# Patient Record
Sex: Male | Born: 1935
Health system: Southern US, Community
[De-identification: ages and names within clinical notes are randomized; demographics above are authoritative.]

## PROBLEM LIST (undated history)

## (undated) DIAGNOSIS — E119 Type 2 diabetes mellitus without complications: Secondary | ICD-10-CM

## (undated) DIAGNOSIS — J449 Chronic obstructive pulmonary disease, unspecified: Secondary | ICD-10-CM

## (undated) DIAGNOSIS — Z720 Tobacco use: Secondary | ICD-10-CM

## (undated) DIAGNOSIS — I10 Essential (primary) hypertension: Secondary | ICD-10-CM

---

## 2009-06-14 ENCOUNTER — Emergency Department: Payer: Self-pay | Admitting: Internal Medicine

## 2015-01-08 DIAGNOSIS — E119 Type 2 diabetes mellitus without complications: Secondary | ICD-10-CM | POA: Diagnosis not present

## 2015-03-06 DIAGNOSIS — I10 Essential (primary) hypertension: Secondary | ICD-10-CM | POA: Diagnosis not present

## 2015-03-06 DIAGNOSIS — E119 Type 2 diabetes mellitus without complications: Secondary | ICD-10-CM | POA: Diagnosis not present

## 2015-03-06 DIAGNOSIS — J449 Chronic obstructive pulmonary disease, unspecified: Secondary | ICD-10-CM | POA: Diagnosis not present

## 2015-03-06 DIAGNOSIS — Z125 Encounter for screening for malignant neoplasm of prostate: Secondary | ICD-10-CM | POA: Diagnosis not present

## 2015-03-13 DIAGNOSIS — I10 Essential (primary) hypertension: Secondary | ICD-10-CM | POA: Diagnosis not present

## 2015-03-13 DIAGNOSIS — H04123 Dry eye syndrome of bilateral lacrimal glands: Secondary | ICD-10-CM | POA: Diagnosis not present

## 2015-03-13 DIAGNOSIS — E119 Type 2 diabetes mellitus without complications: Secondary | ICD-10-CM | POA: Diagnosis not present

## 2015-03-13 DIAGNOSIS — J42 Unspecified chronic bronchitis: Secondary | ICD-10-CM | POA: Diagnosis not present

## 2015-04-12 DIAGNOSIS — E119 Type 2 diabetes mellitus without complications: Secondary | ICD-10-CM | POA: Diagnosis not present

## 2015-07-06 DIAGNOSIS — E119 Type 2 diabetes mellitus without complications: Secondary | ICD-10-CM | POA: Diagnosis not present

## 2015-09-11 DIAGNOSIS — I10 Essential (primary) hypertension: Secondary | ICD-10-CM | POA: Diagnosis not present

## 2015-09-11 DIAGNOSIS — E119 Type 2 diabetes mellitus without complications: Secondary | ICD-10-CM | POA: Diagnosis not present

## 2015-09-11 DIAGNOSIS — J42 Unspecified chronic bronchitis: Secondary | ICD-10-CM | POA: Diagnosis not present

## 2015-09-11 DIAGNOSIS — H04123 Dry eye syndrome of bilateral lacrimal glands: Secondary | ICD-10-CM | POA: Diagnosis not present

## 2015-10-09 DIAGNOSIS — E119 Type 2 diabetes mellitus without complications: Secondary | ICD-10-CM | POA: Diagnosis not present

## 2015-12-20 DIAGNOSIS — E119 Type 2 diabetes mellitus without complications: Secondary | ICD-10-CM | POA: Diagnosis not present

## 2015-12-20 DIAGNOSIS — J449 Chronic obstructive pulmonary disease, unspecified: Secondary | ICD-10-CM | POA: Diagnosis not present

## 2015-12-20 DIAGNOSIS — I10 Essential (primary) hypertension: Secondary | ICD-10-CM | POA: Diagnosis not present

## 2015-12-20 DIAGNOSIS — F17200 Nicotine dependence, unspecified, uncomplicated: Secondary | ICD-10-CM | POA: Diagnosis not present

## 2016-03-12 DIAGNOSIS — I10 Essential (primary) hypertension: Secondary | ICD-10-CM | POA: Diagnosis not present

## 2016-03-12 DIAGNOSIS — E119 Type 2 diabetes mellitus without complications: Secondary | ICD-10-CM | POA: Diagnosis not present

## 2016-03-19 DIAGNOSIS — I10 Essential (primary) hypertension: Secondary | ICD-10-CM | POA: Diagnosis not present

## 2016-03-19 DIAGNOSIS — E1122 Type 2 diabetes mellitus with diabetic chronic kidney disease: Secondary | ICD-10-CM | POA: Diagnosis not present

## 2016-03-19 DIAGNOSIS — N181 Chronic kidney disease, stage 1: Secondary | ICD-10-CM | POA: Diagnosis not present

## 2016-03-19 DIAGNOSIS — J439 Emphysema, unspecified: Secondary | ICD-10-CM | POA: Diagnosis not present

## 2016-03-19 DIAGNOSIS — R490 Dysphonia: Secondary | ICD-10-CM | POA: Diagnosis not present

## 2016-03-19 DIAGNOSIS — R634 Abnormal weight loss: Secondary | ICD-10-CM | POA: Diagnosis not present

## 2016-06-19 DIAGNOSIS — E1122 Type 2 diabetes mellitus with diabetic chronic kidney disease: Secondary | ICD-10-CM | POA: Diagnosis not present

## 2016-06-19 DIAGNOSIS — R5381 Other malaise: Secondary | ICD-10-CM | POA: Diagnosis not present

## 2016-06-19 DIAGNOSIS — R5382 Chronic fatigue, unspecified: Secondary | ICD-10-CM | POA: Diagnosis not present

## 2016-06-19 DIAGNOSIS — N181 Chronic kidney disease, stage 1: Secondary | ICD-10-CM | POA: Diagnosis not present

## 2016-06-19 DIAGNOSIS — I10 Essential (primary) hypertension: Secondary | ICD-10-CM | POA: Diagnosis not present

## 2016-06-19 DIAGNOSIS — J439 Emphysema, unspecified: Secondary | ICD-10-CM | POA: Diagnosis not present

## 2016-06-19 DIAGNOSIS — E782 Mixed hyperlipidemia: Secondary | ICD-10-CM | POA: Diagnosis not present

## 2016-07-03 DIAGNOSIS — H524 Presbyopia: Secondary | ICD-10-CM | POA: Diagnosis not present

## 2016-07-03 DIAGNOSIS — H5210 Myopia, unspecified eye: Secondary | ICD-10-CM | POA: Diagnosis not present

## 2016-07-03 DIAGNOSIS — H353131 Nonexudative age-related macular degeneration, bilateral, early dry stage: Secondary | ICD-10-CM | POA: Diagnosis not present

## 2016-07-04 DIAGNOSIS — Z0100 Encounter for examination of eyes and vision without abnormal findings: Secondary | ICD-10-CM | POA: Diagnosis not present

## 2016-12-17 DIAGNOSIS — I10 Essential (primary) hypertension: Secondary | ICD-10-CM | POA: Diagnosis not present

## 2016-12-17 DIAGNOSIS — E782 Mixed hyperlipidemia: Secondary | ICD-10-CM | POA: Diagnosis not present

## 2016-12-17 DIAGNOSIS — E1122 Type 2 diabetes mellitus with diabetic chronic kidney disease: Secondary | ICD-10-CM | POA: Diagnosis not present

## 2016-12-17 DIAGNOSIS — N181 Chronic kidney disease, stage 1: Secondary | ICD-10-CM | POA: Diagnosis not present

## 2016-12-24 DIAGNOSIS — Z0000 Encounter for general adult medical examination without abnormal findings: Secondary | ICD-10-CM | POA: Diagnosis not present

## 2016-12-24 DIAGNOSIS — I10 Essential (primary) hypertension: Secondary | ICD-10-CM | POA: Diagnosis not present

## 2016-12-24 DIAGNOSIS — R5382 Chronic fatigue, unspecified: Secondary | ICD-10-CM | POA: Diagnosis not present

## 2016-12-24 DIAGNOSIS — N181 Chronic kidney disease, stage 1: Secondary | ICD-10-CM | POA: Diagnosis not present

## 2016-12-24 DIAGNOSIS — E1122 Type 2 diabetes mellitus with diabetic chronic kidney disease: Secondary | ICD-10-CM | POA: Diagnosis not present

## 2016-12-24 DIAGNOSIS — J439 Emphysema, unspecified: Secondary | ICD-10-CM | POA: Diagnosis not present

## 2017-06-18 DIAGNOSIS — E538 Deficiency of other specified B group vitamins: Secondary | ICD-10-CM | POA: Diagnosis not present

## 2017-06-18 DIAGNOSIS — I10 Essential (primary) hypertension: Secondary | ICD-10-CM | POA: Diagnosis not present

## 2017-06-18 DIAGNOSIS — N181 Chronic kidney disease, stage 1: Secondary | ICD-10-CM | POA: Diagnosis not present

## 2017-06-18 DIAGNOSIS — E1122 Type 2 diabetes mellitus with diabetic chronic kidney disease: Secondary | ICD-10-CM | POA: Diagnosis not present

## 2017-06-25 DIAGNOSIS — E1122 Type 2 diabetes mellitus with diabetic chronic kidney disease: Secondary | ICD-10-CM | POA: Diagnosis not present

## 2017-06-25 DIAGNOSIS — I10 Essential (primary) hypertension: Secondary | ICD-10-CM | POA: Diagnosis not present

## 2017-06-25 DIAGNOSIS — J439 Emphysema, unspecified: Secondary | ICD-10-CM | POA: Diagnosis not present

## 2017-06-25 DIAGNOSIS — N181 Chronic kidney disease, stage 1: Secondary | ICD-10-CM | POA: Diagnosis not present

## 2017-06-25 DIAGNOSIS — F17200 Nicotine dependence, unspecified, uncomplicated: Secondary | ICD-10-CM | POA: Diagnosis not present

## 2017-12-24 DIAGNOSIS — N181 Chronic kidney disease, stage 1: Secondary | ICD-10-CM | POA: Diagnosis not present

## 2017-12-24 DIAGNOSIS — I10 Essential (primary) hypertension: Secondary | ICD-10-CM | POA: Diagnosis not present

## 2017-12-24 DIAGNOSIS — E1122 Type 2 diabetes mellitus with diabetic chronic kidney disease: Secondary | ICD-10-CM | POA: Diagnosis not present

## 2017-12-31 DIAGNOSIS — E1122 Type 2 diabetes mellitus with diabetic chronic kidney disease: Secondary | ICD-10-CM | POA: Diagnosis not present

## 2017-12-31 DIAGNOSIS — I10 Essential (primary) hypertension: Secondary | ICD-10-CM | POA: Diagnosis not present

## 2017-12-31 DIAGNOSIS — N181 Chronic kidney disease, stage 1: Secondary | ICD-10-CM | POA: Diagnosis not present

## 2017-12-31 DIAGNOSIS — Z0000 Encounter for general adult medical examination without abnormal findings: Secondary | ICD-10-CM | POA: Diagnosis not present

## 2017-12-31 DIAGNOSIS — J439 Emphysema, unspecified: Secondary | ICD-10-CM | POA: Diagnosis not present

## 2017-12-31 DIAGNOSIS — F17200 Nicotine dependence, unspecified, uncomplicated: Secondary | ICD-10-CM | POA: Diagnosis not present

## 2018-06-23 DIAGNOSIS — I10 Essential (primary) hypertension: Secondary | ICD-10-CM | POA: Diagnosis not present

## 2018-06-23 DIAGNOSIS — N181 Chronic kidney disease, stage 1: Secondary | ICD-10-CM | POA: Diagnosis not present

## 2018-06-23 DIAGNOSIS — E1122 Type 2 diabetes mellitus with diabetic chronic kidney disease: Secondary | ICD-10-CM | POA: Diagnosis not present

## 2018-07-01 DIAGNOSIS — E119 Type 2 diabetes mellitus without complications: Secondary | ICD-10-CM | POA: Diagnosis not present

## 2018-07-01 DIAGNOSIS — I10 Essential (primary) hypertension: Secondary | ICD-10-CM | POA: Diagnosis not present

## 2018-07-01 DIAGNOSIS — E538 Deficiency of other specified B group vitamins: Secondary | ICD-10-CM | POA: Diagnosis not present

## 2018-07-01 DIAGNOSIS — Z0000 Encounter for general adult medical examination without abnormal findings: Secondary | ICD-10-CM | POA: Diagnosis not present

## 2018-07-01 DIAGNOSIS — J439 Emphysema, unspecified: Secondary | ICD-10-CM | POA: Diagnosis not present

## 2018-07-27 DIAGNOSIS — B351 Tinea unguium: Secondary | ICD-10-CM | POA: Diagnosis not present

## 2018-07-27 DIAGNOSIS — E1142 Type 2 diabetes mellitus with diabetic polyneuropathy: Secondary | ICD-10-CM | POA: Diagnosis not present

## 2018-10-27 DIAGNOSIS — B351 Tinea unguium: Secondary | ICD-10-CM | POA: Diagnosis not present

## 2018-10-27 DIAGNOSIS — E1142 Type 2 diabetes mellitus with diabetic polyneuropathy: Secondary | ICD-10-CM | POA: Diagnosis not present

## 2018-10-27 DIAGNOSIS — L851 Acquired keratosis [keratoderma] palmaris et plantaris: Secondary | ICD-10-CM | POA: Diagnosis not present

## 2018-12-29 DIAGNOSIS — E119 Type 2 diabetes mellitus without complications: Secondary | ICD-10-CM | POA: Diagnosis not present

## 2018-12-29 DIAGNOSIS — I10 Essential (primary) hypertension: Secondary | ICD-10-CM | POA: Diagnosis not present

## 2019-01-05 DIAGNOSIS — I10 Essential (primary) hypertension: Secondary | ICD-10-CM | POA: Diagnosis not present

## 2019-01-05 DIAGNOSIS — E119 Type 2 diabetes mellitus without complications: Secondary | ICD-10-CM | POA: Diagnosis not present

## 2019-01-05 DIAGNOSIS — Z0000 Encounter for general adult medical examination without abnormal findings: Secondary | ICD-10-CM | POA: Diagnosis not present

## 2019-01-05 DIAGNOSIS — J439 Emphysema, unspecified: Secondary | ICD-10-CM | POA: Diagnosis not present

## 2019-01-05 DIAGNOSIS — F17200 Nicotine dependence, unspecified, uncomplicated: Secondary | ICD-10-CM | POA: Diagnosis not present

## 2019-01-05 DIAGNOSIS — H5000 Unspecified esotropia: Secondary | ICD-10-CM | POA: Diagnosis not present

## 2019-02-01 DIAGNOSIS — E119 Type 2 diabetes mellitus without complications: Secondary | ICD-10-CM | POA: Diagnosis not present

## 2019-02-01 DIAGNOSIS — H353190 Nonexudative age-related macular degeneration, unspecified eye, stage unspecified: Secondary | ICD-10-CM | POA: Diagnosis not present

## 2019-02-02 DIAGNOSIS — E1142 Type 2 diabetes mellitus with diabetic polyneuropathy: Secondary | ICD-10-CM | POA: Diagnosis not present

## 2019-02-02 DIAGNOSIS — L851 Acquired keratosis [keratoderma] palmaris et plantaris: Secondary | ICD-10-CM | POA: Diagnosis not present

## 2019-02-02 DIAGNOSIS — B351 Tinea unguium: Secondary | ICD-10-CM | POA: Diagnosis not present

## 2019-06-29 DIAGNOSIS — E119 Type 2 diabetes mellitus without complications: Secondary | ICD-10-CM | POA: Diagnosis not present

## 2019-06-29 DIAGNOSIS — I10 Essential (primary) hypertension: Secondary | ICD-10-CM | POA: Diagnosis not present

## 2019-07-06 DIAGNOSIS — I10 Essential (primary) hypertension: Secondary | ICD-10-CM | POA: Diagnosis not present

## 2019-07-06 DIAGNOSIS — F17200 Nicotine dependence, unspecified, uncomplicated: Secondary | ICD-10-CM | POA: Diagnosis not present

## 2019-07-06 DIAGNOSIS — E538 Deficiency of other specified B group vitamins: Secondary | ICD-10-CM | POA: Diagnosis not present

## 2019-07-06 DIAGNOSIS — J439 Emphysema, unspecified: Secondary | ICD-10-CM | POA: Diagnosis not present

## 2019-07-06 DIAGNOSIS — Z0000 Encounter for general adult medical examination without abnormal findings: Secondary | ICD-10-CM | POA: Diagnosis not present

## 2019-07-06 DIAGNOSIS — E119 Type 2 diabetes mellitus without complications: Secondary | ICD-10-CM | POA: Diagnosis not present

## 2019-11-06 ENCOUNTER — Inpatient Hospital Stay
Admission: EM | Admit: 2019-11-06 | Discharge: 2019-11-25 | DRG: 551 | Disposition: A | Discharge status: Hospice | Payer: Medicare HMO | Source: Skilled Nursing Facility | Attending: Internal Medicine | Admitting: Internal Medicine

## 2019-11-06 ENCOUNTER — Observation Stay: Payer: Medicare HMO

## 2019-11-06 ENCOUNTER — Emergency Department: Payer: Medicare HMO

## 2019-11-06 ENCOUNTER — Encounter: Payer: Self-pay | Admitting: Emergency Medicine

## 2019-11-06 ENCOUNTER — Other Ambulatory Visit: Payer: Self-pay

## 2019-11-06 DIAGNOSIS — E119 Type 2 diabetes mellitus without complications: Secondary | ICD-10-CM

## 2019-11-06 DIAGNOSIS — R296 Repeated falls: Secondary | ICD-10-CM | POA: Diagnosis present

## 2019-11-06 DIAGNOSIS — S32010A Wedge compression fracture of first lumbar vertebra, initial encounter for closed fracture: Secondary | ICD-10-CM | POA: Diagnosis not present

## 2019-11-06 DIAGNOSIS — S32009D Unspecified fracture of unspecified lumbar vertebra, subsequent encounter for fracture with routine healing: Secondary | ICD-10-CM | POA: Diagnosis not present

## 2019-11-06 DIAGNOSIS — Z515 Encounter for palliative care: Secondary | ICD-10-CM | POA: Diagnosis not present

## 2019-11-06 DIAGNOSIS — E876 Hypokalemia: Secondary | ICD-10-CM

## 2019-11-06 DIAGNOSIS — Z6822 Body mass index (BMI) 22.0-22.9, adult: Secondary | ICD-10-CM

## 2019-11-06 DIAGNOSIS — Z7984 Long term (current) use of oral hypoglycemic drugs: Secondary | ICD-10-CM

## 2019-11-06 DIAGNOSIS — Z8249 Family history of ischemic heart disease and other diseases of the circulatory system: Secondary | ICD-10-CM

## 2019-11-06 DIAGNOSIS — R52 Pain, unspecified: Secondary | ICD-10-CM

## 2019-11-06 DIAGNOSIS — Z823 Family history of stroke: Secondary | ICD-10-CM | POA: Diagnosis not present

## 2019-11-06 DIAGNOSIS — W19XXXA Unspecified fall, initial encounter: Secondary | ICD-10-CM | POA: Diagnosis not present

## 2019-11-06 DIAGNOSIS — S32029A Unspecified fracture of second lumbar vertebra, initial encounter for closed fracture: Principal | ICD-10-CM | POA: Diagnosis present

## 2019-11-06 DIAGNOSIS — M545 Low back pain, unspecified: Secondary | ICD-10-CM

## 2019-11-06 DIAGNOSIS — Z0189 Encounter for other specified special examinations: Secondary | ICD-10-CM

## 2019-11-06 DIAGNOSIS — R404 Transient alteration of awareness: Secondary | ICD-10-CM | POA: Diagnosis not present

## 2019-11-06 DIAGNOSIS — Z0279 Encounter for issue of other medical certificate: Secondary | ICD-10-CM

## 2019-11-06 DIAGNOSIS — Z79899 Other long term (current) drug therapy: Secondary | ICD-10-CM | POA: Diagnosis not present

## 2019-11-06 DIAGNOSIS — F0391 Unspecified dementia with behavioral disturbance: Secondary | ICD-10-CM | POA: Diagnosis not present

## 2019-11-06 DIAGNOSIS — S32020A Wedge compression fracture of second lumbar vertebra, initial encounter for closed fracture: Secondary | ICD-10-CM | POA: Diagnosis not present

## 2019-11-06 DIAGNOSIS — S32019A Unspecified fracture of first lumbar vertebra, initial encounter for closed fracture: Secondary | ICD-10-CM | POA: Diagnosis not present

## 2019-11-06 DIAGNOSIS — S32019D Unspecified fracture of first lumbar vertebra, subsequent encounter for fracture with routine healing: Secondary | ICD-10-CM | POA: Diagnosis not present

## 2019-11-06 DIAGNOSIS — E440 Moderate protein-calorie malnutrition: Secondary | ICD-10-CM | POA: Diagnosis present

## 2019-11-06 DIAGNOSIS — Y92129 Unspecified place in nursing home as the place of occurrence of the external cause: Secondary | ICD-10-CM

## 2019-11-06 DIAGNOSIS — Z03818 Encounter for observation for suspected exposure to other biological agents ruled out: Secondary | ICD-10-CM | POA: Diagnosis not present

## 2019-11-06 DIAGNOSIS — S32009A Unspecified fracture of unspecified lumbar vertebra, initial encounter for closed fracture: Secondary | ICD-10-CM | POA: Diagnosis not present

## 2019-11-06 DIAGNOSIS — J449 Chronic obstructive pulmonary disease, unspecified: Secondary | ICD-10-CM

## 2019-11-06 DIAGNOSIS — I10 Essential (primary) hypertension: Secondary | ICD-10-CM

## 2019-11-06 DIAGNOSIS — Z23 Encounter for immunization: Secondary | ICD-10-CM | POA: Diagnosis not present

## 2019-11-06 DIAGNOSIS — E860 Dehydration: Secondary | ICD-10-CM | POA: Diagnosis not present

## 2019-11-06 DIAGNOSIS — M549 Dorsalgia, unspecified: Secondary | ICD-10-CM

## 2019-11-06 DIAGNOSIS — K5900 Constipation, unspecified: Secondary | ICD-10-CM | POA: Diagnosis not present

## 2019-11-06 DIAGNOSIS — Z7189 Other specified counseling: Secondary | ICD-10-CM | POA: Diagnosis not present

## 2019-11-06 DIAGNOSIS — R7301 Impaired fasting glucose: Secondary | ICD-10-CM | POA: Diagnosis not present

## 2019-11-06 DIAGNOSIS — R9082 White matter disease, unspecified: Secondary | ICD-10-CM | POA: Diagnosis present

## 2019-11-06 DIAGNOSIS — R410 Disorientation, unspecified: Secondary | ICD-10-CM | POA: Diagnosis not present

## 2019-11-06 DIAGNOSIS — I152 Hypertension secondary to endocrine disorders: Secondary | ICD-10-CM | POA: Diagnosis present

## 2019-11-06 DIAGNOSIS — S299XXA Unspecified injury of thorax, initial encounter: Secondary | ICD-10-CM | POA: Diagnosis not present

## 2019-11-06 DIAGNOSIS — Z20828 Contact with and (suspected) exposure to other viral communicable diseases: Secondary | ICD-10-CM | POA: Diagnosis present

## 2019-11-06 DIAGNOSIS — R1310 Dysphagia, unspecified: Secondary | ICD-10-CM | POA: Diagnosis present

## 2019-11-06 DIAGNOSIS — Z833 Family history of diabetes mellitus: Secondary | ICD-10-CM | POA: Diagnosis not present

## 2019-11-06 DIAGNOSIS — Z66 Do not resuscitate: Secondary | ICD-10-CM | POA: Diagnosis not present

## 2019-11-06 DIAGNOSIS — G9341 Metabolic encephalopathy: Secondary | ICD-10-CM | POA: Diagnosis present

## 2019-11-06 DIAGNOSIS — M5489 Other dorsalgia: Secondary | ICD-10-CM | POA: Diagnosis not present

## 2019-11-06 DIAGNOSIS — E1169 Type 2 diabetes mellitus with other specified complication: Secondary | ICD-10-CM | POA: Diagnosis present

## 2019-11-06 DIAGNOSIS — F0390 Unspecified dementia without behavioral disturbance: Secondary | ICD-10-CM | POA: Diagnosis not present

## 2019-11-06 DIAGNOSIS — F17210 Nicotine dependence, cigarettes, uncomplicated: Secondary | ICD-10-CM | POA: Diagnosis present

## 2019-11-06 DIAGNOSIS — R4182 Altered mental status, unspecified: Secondary | ICD-10-CM | POA: Diagnosis not present

## 2019-11-06 DIAGNOSIS — Z01818 Encounter for other preprocedural examination: Secondary | ICD-10-CM | POA: Diagnosis not present

## 2019-11-06 DIAGNOSIS — R0902 Hypoxemia: Secondary | ICD-10-CM | POA: Diagnosis not present

## 2019-11-06 HISTORY — DX: Tobacco use: Z72.0

## 2019-11-06 HISTORY — DX: Type 2 diabetes mellitus without complications: E11.9

## 2019-11-06 HISTORY — DX: Essential (primary) hypertension: I10

## 2019-11-06 HISTORY — DX: Chronic obstructive pulmonary disease, unspecified: J44.9

## 2019-11-06 LAB — LACTIC ACID, PLASMA
Lactic Acid, Venous: 2.3 mmol/L (ref 0.5–1.9)
Lactic Acid, Venous: 1.5 mmol/L (ref 0.5–1.9)

## 2019-11-06 LAB — URINALYSIS, COMPLETE (UACMP) WITH MICROSCOPIC
Bilirubin Urine: NEGATIVE
Glucose, UA: NEGATIVE mg/dL
Ketones, ur: 20 mg/dL — AB
Leukocytes,Ua: NEGATIVE
Nitrite: NEGATIVE
Protein, ur: 30 mg/dL — AB
Specific Gravity, Urine: 1.023 (ref 1.005–1.030)
pH: 5 (ref 5.0–8.0)

## 2019-11-06 LAB — CBC WITH DIFFERENTIAL/PLATELET
Abs Immature Granulocytes: 0.06 10*3/uL (ref 0.00–0.07)
Basophils Absolute: 0 10*3/uL (ref 0.0–0.1)
Basophils Relative: 0 %
Eosinophils Absolute: 0 10*3/uL (ref 0.0–0.5)
Eosinophils Relative: 0 %
HCT: 42.2 % (ref 39.0–52.0)
Hemoglobin: 14.2 g/dL (ref 13.0–17.0)
Immature Granulocytes: 1 %
Lymphocytes Relative: 2 %
Lymphs Abs: 0.2 10*3/uL — ABNORMAL LOW (ref 0.7–4.0)
MCH: 29.7 pg (ref 26.0–34.0)
MCHC: 33.6 g/dL (ref 30.0–36.0)
MCV: 88.3 fL (ref 80.0–100.0)
Monocytes Absolute: 0.7 10*3/uL (ref 0.1–1.0)
Monocytes Relative: 7 %
Neutro Abs: 9.5 10*3/uL — ABNORMAL HIGH (ref 1.7–7.7)
Neutrophils Relative %: 90 %
Platelets: 148 10*3/uL — ABNORMAL LOW (ref 150–400)
RBC: 4.78 MIL/uL (ref 4.22–5.81)
RDW: 13.2 % (ref 11.5–15.5)
WBC: 10.5 10*3/uL (ref 4.0–10.5)
nRBC: 0 % (ref 0.0–0.2)

## 2019-11-06 LAB — COMPREHENSIVE METABOLIC PANEL
ALT: 22 U/L (ref 0–44)
AST: 36 U/L (ref 15–41)
Albumin: 3.4 g/dL — ABNORMAL LOW (ref 3.5–5.0)
Alkaline Phosphatase: 89 U/L (ref 38–126)
Anion gap: 15 (ref 5–15)
BUN: 24 mg/dL — ABNORMAL HIGH (ref 8–23)
CO2: 28 mmol/L (ref 22–32)
Calcium: 8.8 mg/dL — ABNORMAL LOW (ref 8.9–10.3)
Chloride: 95 mmol/L — ABNORMAL LOW (ref 98–111)
Creatinine, Ser: 0.86 mg/dL (ref 0.61–1.24)
GFR calc Af Amer: >60 mL/min (ref >60)
GFR calc non Af Amer: >60 mL/min (ref >60)
Glucose, Bld: 188 mg/dL — ABNORMAL HIGH (ref 70–99)
Potassium: 3.1 mmol/L — ABNORMAL LOW (ref 3.5–5.1)
Sodium: 138 mmol/L (ref 135–145)
Total Bilirubin: 1.8 mg/dL — ABNORMAL HIGH (ref 0.3–1.2)
Total Protein: 6.8 g/dL (ref 6.5–8.1)

## 2019-11-06 LAB — TSH: TSH: 0.746 u[IU]/mL (ref 0.350–4.500)

## 2019-11-06 LAB — PHOSPHORUS: Phosphorus: 3.2 mg/dL (ref 2.5–4.6)

## 2019-11-06 LAB — MAGNESIUM: Magnesium: 1.8 mg/dL (ref 1.7–2.4)

## 2019-11-06 LAB — TROPONIN I (HIGH SENSITIVITY)
Troponin I (High Sensitivity): 11 ng/L (ref <18)
Troponin I (High Sensitivity): 11 ng/L (ref <18)

## 2019-11-06 MED ORDER — ACETAMINOPHEN 325 MG PO TABS
650.0000 mg | ORAL_TABLET | Freq: Four times a day (QID) | ORAL | Status: DC | PRN
  Administered 2019-11-21: 650 mg via ORAL
  Filled 2019-11-06: qty 2

## 2019-11-06 MED ORDER — POTASSIUM CHLORIDE IN NACL 20-0.9 MEQ/L-% IV SOLN
INTRAVENOUS | Status: AC
  Administered 2019-11-07: 01:00:00 via INTRAVENOUS
  Filled 2019-11-06 (×2): qty 1000

## 2019-11-06 MED ORDER — SODIUM CHLORIDE 0.9 % IV BOLUS
500.0000 mL | Freq: Once | INTRAVENOUS | Status: AC
  Administered 2019-11-06: 500 mL via INTRAVENOUS

## 2019-11-06 MED ORDER — ACETAMINOPHEN 650 MG RE SUPP: 650.0000 mg | Freq: Four times a day (QID) | RECTAL | Status: DC | PRN

## 2019-11-06 MED ORDER — INSULIN ASPART 100 UNIT/ML ~~LOC~~ SOLN
0.0000 [IU] | Freq: Three times a day (TID) | SUBCUTANEOUS | Status: DC
  Administered 2019-11-07: 13:00:00 2 [IU] via SUBCUTANEOUS
  Administered 2019-11-07 – 2019-11-11 (×8): 1 [IU] via SUBCUTANEOUS
  Administered 2019-11-12 – 2019-11-13 (×2): 2 [IU] via SUBCUTANEOUS
  Administered 2019-11-13 (×2): 1 [IU] via SUBCUTANEOUS
  Administered 2019-11-14: 13:00:00 2 [IU] via SUBCUTANEOUS
  Administered 2019-11-15: 09:00:00 1 [IU] via SUBCUTANEOUS
  Administered 2019-11-15 (×2): 2 [IU] via SUBCUTANEOUS
  Administered 2019-11-16 (×2): 1 [IU] via SUBCUTANEOUS
  Filled 2019-11-06 (×20): qty 1

## 2019-11-06 MED ORDER — POTASSIUM CHLORIDE 20 MEQ PO PACK
40.0000 meq | PACK | Freq: Once | ORAL | Status: AC
  Administered 2019-11-07: 01:00:00 40 meq via ORAL
  Filled 2019-11-06: qty 2

## 2019-11-06 MED ORDER — ENOXAPARIN SODIUM 40 MG/0.4ML ~~LOC~~ SOLN
40.0000 mg | SUBCUTANEOUS | Status: AC
  Administered 2019-11-07 – 2019-11-22 (×16): 40 mg via SUBCUTANEOUS
  Filled 2019-11-06 (×18): qty 0.4

## 2019-11-06 NOTE — ED Triage Notes (Addendum)
Pt presents from [facility] via [facility] with c/o  unwitnessed fall and altered mental status. Pt was found on floor by staff. Blood sugar 194. hx of diabetes. . Pt states he has fallen 4 times in the last week. Pt alert and oriented x2 which is not baseline according to facility. Pt remembers fall. Pt able to tell this RN year and location, but Pt stating to this RN "take this cigarette bud out of my fingers". Pt confused.

## 2019-11-06 NOTE — ED Provider Notes (Addendum)
[facility] Emergency Department Provider Note ____________________________________________   First MD Initiated Contact with Patient 11/06/19 1702     (approximate)  I have reviewed the triage vital signs and the nursing notes.   HISTORY  Chief Complaint Altered Mental Status  Level 5 caveat: History of present illness limited altered mental status  HPI Tony Flores is a [age] y.o. male with history of diabetes presents with altered mental status, unclear time of onset.  He was found on the floor by facility staff after an unwitnessed fall today.  Per EMS, the patient has had several falls over the last week.  The patient denies any acute complaints and cannot tell me why he is here for evaluation.  Past Medical History:  Diagnosis Date  . Diabetes mellitus without complication [facility](HCC)     Patient Active Problem List   Diagnosis Date Noted  . Acute metabolic encephalopathy 11/06/2019  . Type 2 diabetes mellitus (HCC) 11/06/2019  . Hypertension associated with diabetes (HCC) 11/06/2019  . COPD (chronic obstructive pulmonary disease) (HCC) 11/06/2019  . Hypokalemia 11/06/2019      Prior to Admission medications   Medication Sig Start Date End Date Taking? Authorizing Provider  atenolol (TENORMIN) 50 MG tablet Take 50 mg by mouth daily. 10/27/19  Yes [provider]  glimepiride (AMARYL) 2 MG tablet Take 2 mg by mouth daily. 10/10/19  Yes [provider]  hydrochlorothiazide (HYDRODIURIL) 25 MG tablet Take 25 mg by mouth daily. 09/08/19  Yes [provider]  metFORMIN (GLUCOPHAGE-XR) 750 MG 24 hr tablet Take 750 mg by mouth 2 (two) times daily. 05/29/19  Yes [provider]    Allergies Patient has no known allergies.  Family History  Problem Relation Age of Onset  . Diabetes Mother   . Heart attack Mother   . Stroke Mother   . Diabetes Father   . Heart attack Father     Social History Social History    Tobacco Use  . Smoking status: Former Smoker  Substance Use Topics  . Alcohol use: Not Currently  . Drug use: Never    Review of Systems Level 5 caveat: Review of systems limited due to altered mental status Constitutional: No fever. Cardiovascular: Denies chest pain. Respiratory: Denies shortness of breath. Gastrointestinal: No vomiting. Musculoskeletal: Positive for chronic back pain. Neurological: Negative for headache.   ____________________________________________   PHYSICAL EXAM:  VITAL SIGNS: ED Triage Vitals [11/06/19 1651]  Enc Vitals Group     BP 132/87     Pulse Rate 89     Resp 14     Temp 98 F (36.7 C)     Temp Source Oral     SpO2 90 %     Weight 130 lb (59 kg)     Height 5\' 10"  (1.778 m)     Head Circumference      Peak Flow      Pain Score 0     Pain Loc      Pain Edu?      Excl. in GC?     Constitutional: Alert, oriented x3 although appears slightly confused, occasionally talk about things that do not make sense.  Relatively comfortable appearing. Eyes: Conjunctivae are normal.  Head: Atraumatic. Nose: No congestion/rhinnorhea. Mouth/Throat: Mucous membranes are dry.   Neck: Normal range of motion.  Cardiovascular: Normal rate, regular rhythm. Grossly normal heart sounds.  Good peripheral circulation. Respiratory: Normal respiratory effort.  No retractions. Lungs CTAB. Gastrointestinal: Soft and  nontender. No distention.  Genitourinary: No flank tenderness. Musculoskeletal: No lower extremity edema.  Extremities warm and well perfused.  Neurologic:  Normal speech and language.  Motor intact in all extremities. Skin:  Skin is warm and dry. No rash noted. Psychiatric: Calm and cooperative.  ____________________________________________   LABS (all labs ordered are listed, but only abnormal results are displayed)  Labs Reviewed  CBC WITH DIFFERENTIAL/PLATELET - Abnormal; Notable for the following components:      Result Value   Platelets  148 (*)    Neutro Abs 9.5 (*)    Lymphs Abs 0.2 (*)    All other components within normal limits  COMPREHENSIVE METABOLIC PANEL - Abnormal; Notable for the following components:   Potassium 3.1 (*)    Chloride 95 (*)    Glucose, Bld 188 (*)    BUN 24 (*)    Calcium 8.8 (*)    Albumin 3.4 (*)    Total Bilirubin 1.8 (*)    All other components within normal limits  LACTIC ACID, PLASMA - Abnormal; Notable for the following components:   Lactic Acid, Venous 2.3 (*)    All other components within normal limits  URINALYSIS, COMPLETE (UACMP) WITH MICROSCOPIC - Abnormal; Notable for the following components:   Color, Urine YELLOW (*)    APPearance HAZY (*)    Hgb urine dipstick MODERATE (*)    Ketones, ur 20 (*)    Protein, ur 30 (*)    Bacteria, UA RARE (*)    All other components within normal limits  SARS CORONAVIRUS 2 (TAT 6-24 HRS)  LACTIC ACID, PLASMA  MAGNESIUM  TROPONIN I (HIGH SENSITIVITY)  TROPONIN I (HIGH SENSITIVITY)   ____________________________________________  EKG  ED ECG REPORT I, Arta Silence, the attending physician, personally viewed and interpreted this ECG.  Date: 11/06/2019 EKG Time: 1647 Rate: 91 Rhythm: normal sinus rhythm QRS Axis: Borderline right axis Intervals: normal ST/T Wave abnormalities: Nonspecific abnormalities Narrative Interpretation: no evidence of acute ischemia  ____________________________________________  RADIOLOGY  CT head: Chronic ischemic findings with no acute abnormality CXR: Bibasilar atelectasis with no focal infiltrate or other acute abnormality  ____________________________________________   PROCEDURES  Procedure(s) performed: No  Procedures  Critical Care performed: No ____________________________________________   INITIAL IMPRESSION / ASSESSMENT AND PLAN / ED COURSE  Pertinent labs & imaging results that were available during my care of the patient were reviewed by me and considered in my medical  decision making (see chart for details).  85 year old male with a history of diabetes presents with altered mental status after being found on the floor at his facility.  He apparently has had several falls in the last few days.  The patient himself is without acute complaints.  On exam he is somewhat elderly and frail-appearing but has normal vital signs and looks comfortable.  He has no visible trauma.  Neurologic exam is nonfocal.    Differential is broad but includes dehydration, electrolyte abnormality, other metabolic cause, medication side effect, cardiac etiology, infection, or less likely stroke or other acute CNS cause.  We will obtain CT head, lab work-up, and reassess.  ----------------------------------------- 9:57 PM on 11/06/2019 -----------------------------------------  Initial lactic acid was slightly elevated, although my suspicion overall for infection was low.  Chest x-ray shows no evidence of pneumonia, and the urinalysis is also negative.  The patient is afebrile and has no elevated WBC count or other signs of sepsis.  Therefore, the lactic acid is more related to dehydration.  After a small fluid  bolus, it has normalized.  The other lab work-up is reassuring.  Initial and repeat troponin were both negative.  The patient briefly had a low O2 saturation when lying flat and was put on 2 L by nasal cannula, however on reassessment I sat him up and took him off of the O2 and he maintained an O2 saturation in the mid 90s.  However, the patient does continue to appear quite confused.  He is at [facility] which is only independent living and apparently not a skilled nursing facility.  It is possible that he is having worsening dementia, however given the relatively acute development of altered mental status, I will admit him for further observation work-up.  He may need placement to a higher level of care if this is his new baseline.  -----------------------------------------  11:06 PM on 11/06/2019 -----------------------------------------  I discussed the patient with hospitalist for admission.  ___________________________  Tony Malkin was evaluated in Emergency Department on 11/06/2019 for the symptoms described in the history of present illness. He was evaluated in the context of the global COVID-19 pandemic, which necessitated consideration that the patient might be at risk for infection with the SARS-CoV-2 virus that causes COVID-19. Institutional protocols and algorithms that pertain to the evaluation of patients at risk for COVID-19 are in a state of rapid change based on information released by regulatory bodies including the CDC and federal and state organizations. These policies and algorithms were followed during the patient's care in the ED.  ____________________________________________   FINAL CLINICAL IMPRESSION(S) / ED DIAGNOSES  Final diagnoses:  Confusion  Dehydration      NEW MEDICATIONS STARTED DURING THIS VISIT:  New Prescriptions   No medications on file     Note:  This document was prepared using Dragon voice recognition software and may include unintentional dictation errors.    Dionne Bucy, MD 11/06/19 2200    Dionne Bucy, MD 11/06/19 2306

## 2019-11-06 NOTE — ED Notes (Signed)
Unable to give report. Called multiple times. Charge RN made aware. Transport to facility arranged.

## 2019-11-06 NOTE — ED Notes (Signed)
Removed patient's O2 per EDP request.

## 2019-11-06 NOTE — ED Notes (Signed)
Pt had bowel movement on self. Pt pants removed and pt cleaned and changed into hospital gown. New brief put on pt. Pt expresses no other needs at this time.

## 2019-11-06 NOTE — H&P (Signed)
History and Physical    Tony Flores OMV:[id number] DOB: [birth year] DOA: 11/06/2019  PCP: Patient, No Pcp Per  Patient coming from: [facility] independent living facility  I have personally briefly reviewed patient's old medical records in [place]  Chief Complaint: Altered mental status  HPI: Tony Flores is a 85 y.o. male with medical history significant for type 2 diabetes, hypertension, COPD, and tobacco use who presents to the ED from his independent living facility via EMS with altered mental status and an unwitnessed fall.  History limited from patient due to encephalopathy therefore majority history is supplemented by EDP and chart review.  Patient currently lives at [facility] independent living facility.  He had a reported unwitnessed fall and was noted to have altered mental status which was a significant change from his baseline per triage notes.  He has apparently had several falls within the last week.  Patient states today he was in his kitchen when he turned around and felt as if his left leg gave out on him and he fell.  He reports having pain in his lower back after the fall.  He reports a chronic cough which he attributes to his chronic tobacco use of 1 pack/day.  He reports having recent watery stool.  He denies any subjective fevers, chills, diaphoresis, chest pain, shortness of breath, abdominal pain, or dysuria.  Staff were unable to recontact [facility] or make contact with family member as listed under demographics.  ED Course:  Initial vitals showed BP 132/87, pulse 89, RR 14, temp 98.0 Fahrenheit, SPO2 90% on room air.  Labs are notable for potassium 3.1, sodium 138, bicarb 28, BUN 24, creatinine 0.86, serum glucose 188, AST 36, ALT 22, alk phos 89, total bilirubin 1.8, WBC 10.5, hemoglobin 14.2, platelets 148,000, lactic acid 2.3, high-sensitivity troponin I 11x2.  Urinalysis showed negative nitrites, negative leukocytes, 0-5 RBCs, 0-5 WBCs, rare  bacteria on microscopy.  SARS-CoV-2 test was ordered and pending.  CT head without contrast was negative for acute intracranial findings.  Chronic ischemic microvascular white matter disease changes and chronic paranasal sinusitis were noted.  Portable chest x-ray showed streaky atelectasis versus scarring at the lung bases without focal consolidation, edema, or effusion.  Patient was given 500 cc normal saline with improvement in lactic acid to 1.5.  The hospitalist service was consulted to admit for further evaluation of acute encephalopathy.  Review of Systems:  Full review of systems limited due to patient's encephalopathy.   Past Medical History:  Diagnosis Date   COPD (chronic obstructive pulmonary disease) ([place])    Diabetes mellitus without complication ([place])    Hypertension    Tobacco use     History reviewed. No pertinent surgical history.  Social History:  reports that he has been smoking. He has been smoking about 1.00 pack per day. He does not have any smokeless tobacco history on file. He reports previous alcohol use. He reports that he does not use drugs.  No Known Allergies  Family History  Problem Relation Age of Onset   Diabetes Mother    Heart attack Mother    Stroke Mother    Diabetes Father    Heart attack Father      Prior to Admission medications   Medication Sig Start Date End Date Taking? Authorizing Provider  atenolol (TENORMIN) 50 MG tablet Take 50 mg by mouth daily. 10/27/19  Yes [provider]  glimepiride (AMARYL) 2 MG tablet Take 2 mg by mouth  daily. 10/10/19  Yes [provider]  hydrochlorothiazide (HYDRODIURIL) 25 MG tablet Take 25 mg by mouth daily. 09/08/19  Yes [provider]  metFORMIN (GLUCOPHAGE-XR) 750 MG 24 hr tablet Take 750 mg by mouth 2 (two) times daily. 05/29/19  Yes [provider]    Physical Exam: Vitals:   11/06/19 2030 11/06/19 2043 11/06/19 2050 11/06/19 2106  BP: (!)  141/82     Pulse:      Resp:      Temp:      TempSrc:      SpO2:  95% 93% 92%  Weight:      Height:        Constitutional: Elderly man resting in the right lateral decubitus position in bed, NAD, calm, comfortable, confused to situation Eyes: PERRL, lids and conjunctivae normal  ENMT: Mucous membranes are dry. Posterior pharynx clear of any exudate or lesions. Neck: normal, supple, no masses. Respiratory: clear to auscultation bilaterally, no wheezing, no crackles. Normal respiratory effort. No accessory muscle use.  Cardiovascular: Regular rate and rhythm, no murmurs / rubs / gallops. No extremity edema. 2+ pedal pulses. Abdomen: no tenderness, no masses palpated. No hepatosplenomegaly. Bowel sounds positive.  Musculoskeletal: Thin extremities, no clubbing / cyanosis. No joint deformity upper and lower extremities. Good ROM, no contractures. Normal muscle tone.  Skin: Bruising lower back and right hip. No induration Neurologic: CN 2-12 grossly intact. Sensation intact, Strength 5/5 in all 4.  Psychiatric: Alert and oriented to self and year, states that he is at [facility].  He is confused to situation and unable to provide significant recent history.   Labs on Admission: I have personally reviewed following labs and imaging studies  CBC: Recent Labs  Lab 11/06/19 1706  WBC 10.5  NEUTROABS 9.5*  HGB 14.2  HCT 42.2  MCV 88.3  PLT 161*   Basic Metabolic Panel: Recent Labs  Lab 11/06/19 1706 11/06/19 1921  NA 138  --   K 3.1*  --   CL 95*  --   CO2 28  --   GLUCOSE 188*  --   BUN 24*  --   CREATININE 0.86  --   CALCIUM 8.8*  --   MG  --  1.8   GFR: Estimated Creatinine Clearance: 51.5 mL/min (by C-G formula based on SCr of 0.86 mg/dL). Liver Function Tests: Recent Labs  Lab 11/06/19 1706  AST 36  ALT 22  ALKPHOS 89  BILITOT 1.8*  PROT 6.8  ALBUMIN 3.4*   No results for input(s): LIPASE, AMYLASE in the last 168 hours. No results for input(s): AMMONIA in  the last 168 hours. Coagulation Profile: No results for input(s): INR, PROTIME in the last 168 hours. Cardiac Enzymes: No results for input(s): CKTOTAL, CKMB, CKMBINDEX, TROPONINI in the last 168 hours. BNP (last 3 results) No results for input(s): PROBNP in the last 8760 hours. HbA1C: No results for input(s): HGBA1C in the last 72 hours. CBG: No results for input(s): GLUCAP in the last 168 hours. Lipid Profile: No results for input(s): CHOL, HDL, LDLCALC, TRIG, CHOLHDL, LDLDIRECT in the last 72 hours. Thyroid Function Tests: No results for input(s): TSH, T4TOTAL, FREET4, T3FREE, THYROIDAB in the last 72 hours. Anemia Panel: No results for input(s): VITAMINB12, FOLATE, FERRITIN, TIBC, IRON, RETICCTPCT in the last 72 hours. Urine analysis:    Component Value Date/Time   COLORURINE YELLOW (A) 11/06/2019 1717   APPEARANCEUR HAZY (A) 11/06/2019 1717   LABSPEC 1.023 11/06/2019 1717   PHURINE 5.0  11/06/2019 [place] 11/06/2019 1717   HGBUR MODERATE (A) 11/06/2019 [place] 11/06/2019 1717   KETONESUR 20 (A) 11/06/2019 1717   PROTEINUR 30 (A) 11/06/2019 1717   NITRITE NEGATIVE 11/06/2019 1717   LEUKOCYTESUR NEGATIVE 11/06/2019 1717    Radiological Exams on Admission: Ct Head Wo Contrast  Result Date: 11/06/2019 CLINICAL DATA:  Unwitnessed fall, altered mental status. Hyperglycemia. EXAM: CT HEAD WITHOUT CONTRAST TECHNIQUE: Contiguous axial images were obtained from the base of the skull through the vertex without intravenous contrast. COMPARISON:  None. FINDINGS: Brain: The brainstem, cerebellum, cerebral peduncles, thalami, basal ganglia, basilar cisterns, and ventricular system appear within normal limits. Periventricular white matter and corona radiata hypodensities favor chronic ischemic microvascular white matter disease. No intracranial hemorrhage, mass lesion, or acute CVA. Vascular: There is atherosclerotic calcification of the cavernous carotid  arteries bilaterally. Skull: Prominent confluence of occipital arachnoid granulations, likely incidental. Sinuses/Orbits: Chronic ethmoid, frontal, and left sphenoid sinusitis. Other: No supplemental non-categorized findings. IMPRESSION: 1. No acute intracranial findings. 2. Periventricular white matter and corona radiata hypodensities favor chronic ischemic microvascular white matter disease. 3. Chronic paranasal sinusitis. 4. Prominent confluence of occipital arachnoid granulations. Electronically Signed   By: Van Clines M.D.   On: 11/06/2019 18:15   Dg Chest Portable 1 View  Result Date: 11/06/2019 CLINICAL DATA:  Elevated lactate unwitnessed fall EXAM: PORTABLE CHEST 1 VIEW COMPARISON:  None. FINDINGS: Streaky atelectasis or scarring at the bases. No consolidation or effusion. Cardiomediastinal silhouette within normal limits with aortic atherosclerosis. No pneumothorax. IMPRESSION: Mild scarring or atelectasis at the bases. Electronically Signed   By: Donavan Foil M.D.   On: 11/06/2019 19:10    EKG: Independently reviewed. Sinus rhythm with motion artifact, slight ST depression V4-V6.  No prior for comparison.  Assessment/Plan Principal Problem:   Acute metabolic encephalopathy Active Problems:   Type 2 diabetes mellitus (HCC)   Hypertension associated with diabetes ([place])   COPD (chronic obstructive pulmonary disease) ([place])   Hypokalemia  Tony Flores is a 85 y.o. male with medical history significant for type 2 diabetes, hypertension, COPD, and tobacco use who is admitted with acute encephalopathy with reported falls at his independent living facility.  Acute encephalopathy: Unclear etiology, confusion reportedly significant change from his baseline.  Possible acute encephalopathy on top of cognitive impairment.  No obvious infectious cause.  He is dehydrated on examination.  CT head is without acute findings, does show chronic ischemic microvascular white matter  disease. -Continue IV fluid resuscitation overnight -Check TSH, B12, UDS -Unable to contact any family or his independent living facility, he is unsafe to be discharged home in his current state  Falls: Reported multiple falls over the last week without obvious significant injury.  Possibly related to dehydration.  He is complaining of lower back pain. -Continue IV fluid resuscitation as above -Hold home HCTZ -PT/OT eval -Fall precautions -Check lumbar spine x-ray  Hypokalemia: Will replete orally and add potassium supplement to IV fluids.  Magnesium is 1.8.  Holding HCTZ for now.  COPD: Chronic and stable without active wheezing or hypoxia.  Not requiring maintenance therapy as an outpatient.  Continue monitor.  Hypertension: Currently normotensive.  Holding HCTZ as above, will also hold atenolol for now.  Type 2 diabetes: On Metformin and Amaryl as an outpatient.  Will use sensitive SSI while in hospital.  Tobacco use: Reports ongoing tobacco use and smoking 1 pack/day.  DVT prophylaxis: Lovenox Code Status: Full code, patient currently unable to  provide adequate insight into goals of care given his current encephalopathy.  Unable to contact any family. Family Communication: Unable to contact family on admission. Disposition Plan: Pending clinical progress Consults called: None Admission status: Observation   Zada Finders MD [facility]  If 7PM-7AM, please contact night-coverage [email or web address]  11/06/2019, 11:31 PM

## 2019-11-06 NOTE — ED Notes (Signed)
Patient is alert and Oriented x 2. Patient knows who he is and did states it was Sunday. Patient asking for breakfast. EDP made aware due to patient living in a independent living apartment with no one sstay with him. Unable to get in touch with son or staff at [facility]. Will continue to monitor.

## 2019-11-06 NOTE — ED Notes (Signed)
Tried to call [facility] to give report on patient. Patient currently up for discharge.

## 2019-11-06 NOTE — Discharge Instructions (Signed)
There are no signs of significant head injury, infection, or other indication for admission to the hospital at this time.  Return to the ER for new or worsening weakness, confusion, fever, difficulty breathing or any other new or worsening symptoms.

## 2019-11-06 NOTE — ED Notes (Signed)
X-ray at bedside

## 2019-11-07 ENCOUNTER — Observation Stay: Payer: Medicare HMO

## 2019-11-07 DIAGNOSIS — Z515 Encounter for palliative care: Secondary | ICD-10-CM | POA: Diagnosis not present

## 2019-11-07 DIAGNOSIS — K5900 Constipation, unspecified: Secondary | ICD-10-CM | POA: Diagnosis not present

## 2019-11-07 DIAGNOSIS — G9341 Metabolic encephalopathy: Secondary | ICD-10-CM | POA: Diagnosis present

## 2019-11-07 DIAGNOSIS — W19XXXA Unspecified fall, initial encounter: Secondary | ICD-10-CM | POA: Diagnosis present

## 2019-11-07 DIAGNOSIS — I10 Essential (primary) hypertension: Secondary | ICD-10-CM | POA: Diagnosis not present

## 2019-11-07 DIAGNOSIS — Z66 Do not resuscitate: Secondary | ICD-10-CM | POA: Diagnosis not present

## 2019-11-07 DIAGNOSIS — F0390 Unspecified dementia without behavioral disturbance: Secondary | ICD-10-CM | POA: Diagnosis present

## 2019-11-07 DIAGNOSIS — E860 Dehydration: Secondary | ICD-10-CM

## 2019-11-07 DIAGNOSIS — S32009A Unspecified fracture of unspecified lumbar vertebra, initial encounter for closed fracture: Secondary | ICD-10-CM

## 2019-11-07 DIAGNOSIS — F17210 Nicotine dependence, cigarettes, uncomplicated: Secondary | ICD-10-CM | POA: Diagnosis present

## 2019-11-07 DIAGNOSIS — I152 Hypertension secondary to endocrine disorders: Secondary | ICD-10-CM | POA: Diagnosis present

## 2019-11-07 DIAGNOSIS — Z833 Family history of diabetes mellitus: Secondary | ICD-10-CM | POA: Diagnosis not present

## 2019-11-07 DIAGNOSIS — J449 Chronic obstructive pulmonary disease, unspecified: Secondary | ICD-10-CM | POA: Diagnosis present

## 2019-11-07 DIAGNOSIS — S32019D Unspecified fracture of first lumbar vertebra, subsequent encounter for fracture with routine healing: Secondary | ICD-10-CM | POA: Diagnosis not present

## 2019-11-07 DIAGNOSIS — Z7984 Long term (current) use of oral hypoglycemic drugs: Secondary | ICD-10-CM | POA: Diagnosis not present

## 2019-11-07 DIAGNOSIS — E440 Moderate protein-calorie malnutrition: Secondary | ICD-10-CM | POA: Diagnosis present

## 2019-11-07 DIAGNOSIS — Z23 Encounter for immunization: Secondary | ICD-10-CM | POA: Diagnosis not present

## 2019-11-07 DIAGNOSIS — Z823 Family history of stroke: Secondary | ICD-10-CM | POA: Diagnosis not present

## 2019-11-07 DIAGNOSIS — Z79899 Other long term (current) drug therapy: Secondary | ICD-10-CM | POA: Diagnosis not present

## 2019-11-07 DIAGNOSIS — Z8249 Family history of ischemic heart disease and other diseases of the circulatory system: Secondary | ICD-10-CM | POA: Diagnosis not present

## 2019-11-07 DIAGNOSIS — S32019A Unspecified fracture of first lumbar vertebra, initial encounter for closed fracture: Secondary | ICD-10-CM | POA: Diagnosis not present

## 2019-11-07 DIAGNOSIS — E876 Hypokalemia: Secondary | ICD-10-CM | POA: Diagnosis present

## 2019-11-07 DIAGNOSIS — S32009D Unspecified fracture of unspecified lumbar vertebra, subsequent encounter for fracture with routine healing: Secondary | ICD-10-CM | POA: Diagnosis not present

## 2019-11-07 DIAGNOSIS — M545 Low back pain: Secondary | ICD-10-CM | POA: Diagnosis not present

## 2019-11-07 DIAGNOSIS — Z20828 Contact with and (suspected) exposure to other viral communicable diseases: Secondary | ICD-10-CM | POA: Diagnosis present

## 2019-11-07 DIAGNOSIS — R9082 White matter disease, unspecified: Secondary | ICD-10-CM | POA: Diagnosis present

## 2019-11-07 DIAGNOSIS — R410 Disorientation, unspecified: Secondary | ICD-10-CM

## 2019-11-07 DIAGNOSIS — Z01818 Encounter for other preprocedural examination: Secondary | ICD-10-CM | POA: Diagnosis not present

## 2019-11-07 DIAGNOSIS — R296 Repeated falls: Secondary | ICD-10-CM | POA: Diagnosis present

## 2019-11-07 DIAGNOSIS — E1169 Type 2 diabetes mellitus with other specified complication: Secondary | ICD-10-CM | POA: Diagnosis present

## 2019-11-07 DIAGNOSIS — S32029A Unspecified fracture of second lumbar vertebra, initial encounter for closed fracture: Secondary | ICD-10-CM | POA: Diagnosis present

## 2019-11-07 DIAGNOSIS — Y92129 Unspecified place in nursing home as the place of occurrence of the external cause: Secondary | ICD-10-CM | POA: Diagnosis not present

## 2019-11-07 DIAGNOSIS — R7301 Impaired fasting glucose: Secondary | ICD-10-CM | POA: Diagnosis present

## 2019-11-07 LAB — CBC
HCT: 38.4 % — ABNORMAL LOW (ref 39.0–52.0)
Hemoglobin: 13.1 g/dL (ref 13.0–17.0)
MCH: 29.5 pg (ref 26.0–34.0)
MCHC: 34.1 g/dL (ref 30.0–36.0)
MCV: 86.5 fL (ref 80.0–100.0)
Platelets: 145 10*3/uL — ABNORMAL LOW (ref 150–400)
RBC: 4.44 MIL/uL (ref 4.22–5.81)
RDW: 13.2 % (ref 11.5–15.5)
WBC: 9 10*3/uL (ref 4.0–10.5)
nRBC: 0 % (ref 0.0–0.2)

## 2019-11-07 LAB — BASIC METABOLIC PANEL
Anion gap: 13 (ref 5–15)
BUN: 25 mg/dL — ABNORMAL HIGH (ref 8–23)
CO2: 26 mmol/L (ref 22–32)
Calcium: 8.3 mg/dL — ABNORMAL LOW (ref 8.9–10.3)
Chloride: 103 mmol/L (ref 98–111)
Creatinine, Ser: 0.62 mg/dL (ref 0.61–1.24)
GFR calc Af Amer: >60 mL/min (ref >60)
GFR calc non Af Amer: >60 mL/min (ref >60)
Glucose, Bld: 165 mg/dL — ABNORMAL HIGH (ref 70–99)
Potassium: 3.5 mmol/L (ref 3.5–5.1)
Sodium: 142 mmol/L (ref 135–145)

## 2019-11-07 LAB — URINE DRUG SCREEN, QUALITATIVE (ARMC ONLY)
Amphetamines, Ur Screen: NOT DETECTED
Barbiturates, Ur Screen: NOT DETECTED
Benzodiazepine, Ur Scrn: NOT DETECTED
Cannabinoid 50 Ng, Ur ~~LOC~~: NOT DETECTED
Cocaine Metabolite,Ur ~~LOC~~: NOT DETECTED
MDMA (Ecstasy)Ur Screen: NOT DETECTED
Methadone Scn, Ur: NOT DETECTED
Opiate, Ur Screen: NOT DETECTED
Phencyclidine (PCP) Ur S: NOT DETECTED
Tricyclic, Ur Screen: NOT DETECTED

## 2019-11-07 LAB — GLUCOSE, CAPILLARY
Glucose-Capillary: 144 mg/dL — ABNORMAL HIGH (ref 70–99)
Glucose-Capillary: 155 mg/dL — ABNORMAL HIGH (ref 70–99)
Glucose-Capillary: 102 mg/dL — ABNORMAL HIGH (ref 70–99)

## 2019-11-07 LAB — HEMOGLOBIN A1C
Hgb A1c MFr Bld: 5.7 % — ABNORMAL HIGH (ref 4.8–5.6)
Mean Plasma Glucose: 116.89 mg/dL

## 2019-11-07 LAB — SARS CORONAVIRUS 2 (TAT 6-24 HRS): SARS Coronavirus 2: NEGATIVE

## 2019-11-07 LAB — VITAMIN B12: Vitamin B-12: 120 pg/mL — ABNORMAL LOW (ref 180–914)

## 2019-11-07 MED ORDER — POTASSIUM CHLORIDE IN NACL 20-0.9 MEQ/L-% IV SOLN
INTRAVENOUS | Status: AC
  Administered 2019-11-07 (×2): via INTRAVENOUS
  Filled 2019-11-07 (×2): qty 1000

## 2019-11-07 MED ORDER — MORPHINE SULFATE (PF) 2 MG/ML IV SOLN
0.5000 mg | INTRAVENOUS | Status: DC | PRN
  Administered 2019-11-07 (×2): 0.5 mg via INTRAVENOUS
  Filled 2019-11-07 (×2): qty 1

## 2019-11-07 MED ORDER — FENTANYL CITRATE (PF) 100 MCG/2ML IJ SOLN
50.0000 ug | Freq: Once | INTRAMUSCULAR | Status: AC
  Administered 2019-11-07: 50 ug via INTRAVENOUS
  Filled 2019-11-07: qty 2

## 2019-11-07 MED ORDER — ONDANSETRON HCL 4 MG/2ML IJ SOLN
4.0000 mg | Freq: Once | INTRAMUSCULAR | Status: AC
  Administered 2019-11-07: 02:00:00 4 mg via INTRAVENOUS
  Filled 2019-11-07: qty 2

## 2019-11-07 MED ORDER — HYDROCODONE-ACETAMINOPHEN 5-325 MG PO TABS
1.0000 | ORAL_TABLET | ORAL | Status: DC | PRN
  Administered 2019-11-07 – 2019-11-16 (×18): 1 via ORAL
  Filled 2019-11-07 (×18): qty 1

## 2019-11-07 MED ORDER — MORPHINE SULFATE (PF) 2 MG/ML IV SOLN
1.0000 mg | INTRAVENOUS | Status: DC | PRN
  Administered 2019-11-08 – 2019-11-15 (×3): 1 mg via INTRAVENOUS
  Filled 2019-11-07 (×3): qty 1

## 2019-11-07 NOTE — TOC Progression Note (Signed)
Transition of Care [facility]) - Progression Note    Patient Details  Name: Tony Flores MRN: [id number] Date of Birth: 05/31/1930  Transition of Care [facility]) CM/SW Contact  Su Hilt, RN Phone Number: 11/07/2019, 1:00 PM  Clinical Narrative:     Arnoldo Lenis and spoke to [place] to obtain medicare number Left a vm for a call back       Expected Discharge Plan and Services                                                 Social Determinants of Health (SDOH) Interventions    Readmission Risk Interventions No flowsheet data found.

## 2019-11-07 NOTE — NC FL2 (Signed)
  [place] LEVEL OF CARE SCREENING TOOL     IDENTIFICATION  Patient Name: Tony Flores Birthdate: 1930/08/15 Sex: male Admission Date (Current Location): 11/06/2019  [place] and [place] Number:  Engineering geologist and Address:  [facility], [address], [place], [place] [address]      Provider Number: [id number]  Attending Physician Name and Address:  Max Sane, MD  Relative Name and Phone Number:  Scherrie Bateman [phone]    Current Level of Care: Hospital Recommended Level of Care: [place] Prior Approval Number:    Date Approved/Denied:   PASRR Number: [id number]  Discharge Plan: SNF    Current Diagnoses: Patient Active Problem List   Diagnosis Date Noted  . Closed fracture dislocation of lumbar spine ([place]) 11/07/2019  . Acute metabolic encephalopathy 60/73/7106  . Type 2 diabetes mellitus ([place]) 11/06/2019  . Hypertension associated with diabetes ([place]) 11/06/2019  . COPD (chronic obstructive pulmonary disease) ([place]) 11/06/2019  . Hypokalemia 11/06/2019    Orientation RESPIRATION BLADDER Height & Weight     Self, Place  Normal Continent Weight: 70.3 kg Height:  5\' 10"  (177.8 cm)  BEHAVIORAL SYMPTOMS/MOOD NEUROLOGICAL BOWEL NUTRITION STATUS      Continent    AMBULATORY STATUS COMMUNICATION OF NEEDS Skin   Extensive Assist Verbally                         Personal Care Assistance Level of Assistance  Dressing     Dressing Assistance: Limited assistance     Functional Limitations Info             SPECIAL CARE FACTORS FREQUENCY  PT (By licensed PT)     PT Frequency: 5 times per week              Contractures Contractures Info: Not present    Additional Factors Info  Allergies   Allergies Info: NKDA           Current Medications (11/07/2019):  This is the current hospital active medication list Current Facility-Administered Medications  Medication Dose Route  Frequency Provider Last Rate Last Dose  . 0.9 % NaCl with KCl 20 mEq/ L  infusion   Intravenous Continuous Max Sane, MD 100 mL/hr at 11/07/19 1314    . acetaminophen (TYLENOL) tablet 650 mg  650 mg Oral Q6H PRN Lenore Cordia, MD       Or  . acetaminophen (TYLENOL) suppository 650 mg  650 mg Rectal Q6H PRN Zada Finders R, MD      . enoxaparin (LOVENOX) injection 40 mg  40 mg Subcutaneous Q24H Zada Finders R, MD   40 mg at 11/07/19 0835  . insulin aspart (novoLOG) injection 0-9 Units  0-9 Units Subcutaneous TID WC Lenore Cordia, MD   2 Units at 11/07/19 1314  . morphine 2 MG/ML injection 0.5 mg  0.5 mg Intravenous Q4H PRN Lenore Cordia, MD   0.5 mg at 11/07/19 1312     Discharge Medications: Please see discharge summary for a list of discharge medications.  Relevant Imaging Results:  Relevant Lab Results:   Additional Information SS# [id number]  Su Hilt, RN

## 2019-11-07 NOTE — Evaluation (Signed)
Occupational Therapy Evaluation Patient Details Name: Tony Flores MRN: [id number] DOB: 01/13/1930 Today's Date: 11/07/2019    History of Present Illness 85 y.o. male with medical history significant for type 2 diabetes, hypertension, COPD, and tobacco use who presents to the ED from his independent living facility via EMS with altered mental status and an unwitnessed fall. Apparently he has had ~4 falls in the last week or so.   Clinical Impression   Pt seen for OT evaluation this date. Prior to hospital admission, pt was apparently Indep with self care and fxl mobilty. Pt lives in ILF.  Currently pt demonstrates impairments in strength, tolerance, and safety awareness requiring supv-MIN A for bed level grooming ADLs and unable to tolerate ADL mobility on OT eval.  Pt would benefit from skilled OT to address noted impairments and functional limitations (see below for any additional details) in order to maximize safety and independence while minimizing falls risk and caregiver burden.  Upon hospital discharge, recommend pt discharge to SNF.    Follow Up Recommendations  SNF    Equipment Recommendations  Other (comment)(defer to next venue of care.)    Recommendations for Other Services       Precautions / Restrictions Precautions Precautions: Fall Restrictions Weight Bearing Restrictions: No      Mobility Bed Mobility Overal bed mobility: Needs Assistance Bed Mobility: Supine to Sit     Supine to sit: Mod assist     General bed mobility comments: Pt does not participate in rolling or sup<>sit with OT on evaluation, but for repositioning to adjust trunk/limbs to more safe and comfortable alignment, pt requires MIN/MOD A.  Transfers Overall transfer level: Needs assistance Equipment used: Rolling walker (2 wheeled) Transfers: Sit to/from Stand Sit to Stand: Mod assist         General transfer comment: deferred at this time.    Balance Overall balance assessment:  Needs assistance Sitting-balance support: Bilateral upper extremity supported Sitting balance-Leahy Scale: Fair Sitting balance - Comments: defer   Standing balance support: Bilateral upper extremity supported Standing balance-Leahy Scale: Fair Standing balance comment: defer                           ADL either performed or assessed with clinical judgement   ADL Overall ADL's : Needs assistance/impaired Eating/Feeding: Set up;Supervision/ safety;Sitting   Grooming: Wash/dry face;Oral care;Set up;Supervision/safety;Bed level Grooming Details (indicate cue type and reason): OT attempts to get pt to sit EOB to perform grooming tasks. Pt perseverates on not getting OOB without a pain pill (mentioned 3-4 times). Upper Body Bathing: Minimal assistance;Sitting Upper Body Bathing Details (indicate cue type and reason): based on clinical observation                           General ADL Comments: much of self care ADL assessment unable to be completed on OT eval as pt with waxing/waning eye opening/attention to task and does not wish to sit up for assessment.     Vision Patient Visual Report: No change from baseline       Perception     Praxis      Pertinent Vitals/Pain Pain Assessment: Faces Faces Pain Scale: Hurts even more Pain Location: pt resistant to mobilization, states back hurts. Perseverates on pain medication. Pain Descriptors / Indicators: Aching;Guarding Pain Intervention(s): Limited activity within patient's tolerance;Monitored during session;Patient requesting pain meds-RN notified     Hand Dominance  Extremity/Trunk Assessment Upper Extremity Assessment Upper Extremity Assessment: RUE deficits/detail;LUE deficits/detail RUE Deficits / Details: shoulder 3-/5, elbow 4-/5, grip 4-/5 LUE Deficits / Details: shoulder 3-/5, elbow 4-/5, grip 4-/5   Lower Extremity Assessment Lower Extremity Assessment: Defer to PT evaluation;Generalized  weakness       Communication Communication Communication: Other (comment)(slow resposiveness.)   Cognition Arousal/Alertness: Awake/alert Behavior During Therapy: Flat affect Overall Cognitive Status: Difficult to assess                                 General Comments: While appropriate with self and month/year-does not know where he is or how he got here. He is delayed in responding or sometimes does not respond. He perseverates on getting a pain pill although OT has already informed him that upon speaking with nurse, she is attempting to obtain order for oral pain med from MD.   General Comments       Exercises Other Exercises Other Exercises: OT facilittates education with pt re: importance of OOB activity, role of OT, general safety/fall prevention awareness/considerations including encouraging call bell use and notifying pt of bed alarm. Pt verbalized understanding.   Shoulder Instructions      Home Living Family/patient expects to be discharged to:: Unsure                                 Additional Comments: PT from [place], reports he does not need an AD?      Prior Functioning/Environment          Comments: Pt is questionable historian, somewhat confused. Pt states he was Indep at baseline with no AD for fxl mobility. Usure of truth to this.        OT Problem List: Decreased range of motion;Decreased activity tolerance;Decreased strength;Impaired balance (sitting and/or standing);Decreased safety awareness;Decreased knowledge of use of DME or AE;Impaired UE functional use;Pain      OT Treatment/Interventions: Self-care/ADL training;Therapeutic exercise;Energy conservation;Therapeutic activities;Patient/family education;Balance training    OT Goals(Current goals can be found in the care plan section) Acute Rehab OT Goals Patient Stated Goal: none stated  OT Frequency: Min 1X/week   Barriers to D/C:             Co-evaluation              AM-PAC OT "6 Clicks" Daily Activity     Outcome Measure Help from another person eating meals?: None Help from another person taking care of personal grooming?: A Little Help from another person toileting, which includes using toliet, bedpan, or urinal?: A Lot Help from another person bathing (including washing, rinsing, drying)?: A Lot Help from another person to put on and taking off regular upper body clothing?: A Little Help from another person to put on and taking off regular lower body clothing?: A Lot 6 Click Score: 16   End of Session    Activity Tolerance: Patient limited by lethargy Patient left: in bed;with call bell/phone within reach;with bed alarm set  OT Visit Diagnosis: Muscle weakness (generalized) (M62.81)                Time: 1610-9604 OT Time Calculation (min): 17 min Charges:  OT General Charges $OT Visit: 1 Visit OT Evaluation $OT Eval Moderate Complexity: 1 [address] [place], OTR/L ascom [phone] or [phone] 11/07/19, 1:29 PM

## 2019-11-07 NOTE — TOC Progression Note (Signed)
Transition of Care [facility]) - Progression Note    Patient Details  Name: Tony Flores MRN: [id number] Date of Birth: [birth year]  Transition of Care [facility]) CM/SW [place], RN Phone Number: 11/07/2019, 4:04 PM  Clinical Narrative:    Sent bed search for the patient and will review with Scherrie Bateman once we get bed offers [facility] and they do not manage the insurance for the patient        Expected Discharge Plan and Services                                                 Social Determinants of Health (SDOH) Interventions    Readmission Risk Interventions No flowsheet data found.

## 2019-11-07 NOTE — Evaluation (Signed)
Physical Therapy Evaluation Patient Details Name: Tony Flores MRN: [id number] DOB: [birth year] Today's Date: 11/07/2019   History of Present Illness  85 y.o. male with medical history significant for type 2 diabetes, hypertension, COPD, and tobacco use who presents to the ED from his independent living facility via EMS with altered mental status and an unwitnessed fall. Apparently he has had ~4 falls in the last week or so.  Clinical Impression  Pt able to participate with PT and follow simple instructions, but showed consistent confusion and often very delayed and even absent responses.  He was unable to do bed mobility or get to standing w/o assist and though he managed a few small, unsure, shuffling steps he ultimately was unable to ambulate.  Pt indicates that he is relatively independent at baseline (?), regardless he is very functionally limited at this time and will need short term rehab to get back to a more independent/functional status.    Follow Up Recommendations SNF;Supervision/Assistance - 24 hour    Equipment Recommendations  (TBD at next venue of care)    Recommendations for Other Services       Precautions / Restrictions Precautions Precautions: Fall Restrictions Weight Bearing Restrictions: No      Mobility  Bed Mobility Overal bed mobility: Needs Assistance Bed Mobility: Supine to Sit     Supine to sit: Mod assist     General bed mobility comments: Pt was able to make 2 very minimal effort attempts to get to sitting and could not elevate torse at all, ultimately needed considerable assist to attain sitting  Transfers Overall transfer level: Needs assistance Equipment used: Rolling walker (2 wheeled) Transfers: Sit to/from Stand Sit to Stand: Mod assist         General transfer comment: Pt again made ~2 attempts to get to standing w/o physical assist, he was unsuccessful.  Ultimately needed considerable assistance to get to  sitting  Ambulation/Gait Ambulation/Gait assistance: Mod assist Gait Distance (Feet): 4 Feet Assistive device: Rolling walker (2 wheeled)       General Gait Details: Despite a lot of cuing and encouragement pt was not able to take more than ~3 steps before needing to sit.  Pt with poor awareness, safety and follow through with the effort, pulled chair behind him to sit.  Stairs            Wheelchair Mobility    Modified Rankin (Stroke Patients Only)       Balance Overall balance assessment: Needs assistance Sitting-balance support: Bilateral upper extremity supported Sitting balance-Leahy Scale: Fair Sitting balance - Comments: Pt able to maintain sitting balance at EOB w/o assist once helped to positiong   Standing balance support: Bilateral upper extremity supported Standing balance-Leahy Scale: Fair Standing balance comment: Pt with decreased safety (and general) awareness in standing, needed very close assist t/o the effort to insure safety.                             Pertinent Vitals/Pain Pain Assessment: Faces Faces Pain Scale: Hurts even more Pain Location: low back, less so in R shoulder    Home Living Family/patient expects to be discharged to:: Unsure                 Additional Comments: PT from [facility] ILF, reports he does not need an AD?    Prior Function           Comments: Pt somewhat  confused, indicates that he does not normally need AD (?) and can be generally active - unsure as to the veracity of these claims      Hand Dominance        Extremity/Trunk Assessment   Upper Extremity Assessment Upper Extremity Assessment: Generalized weakness(unable to elevate either shoulder >70, R painful)    Lower Extremity Assessment Lower Extremity Assessment: Generalized weakness;Overall WFL for tasks assessed(grossly 4-/5 t/o)       Communication   Communication: (slow and sometimes absent responses to basic questions)   Cognition Arousal/Alertness: Awake/alert Behavior During Therapy: Flat affect Overall Cognitive Status: Difficult to assess                                 General Comments: Pt oriented to self and date, did not know where he was or how/why he got here      General Comments      Exercises     Assessment/Plan    PT Assessment Patient needs continued PT services  PT Problem List Decreased strength;Decreased range of motion;Decreased balance;Decreased activity tolerance;Decreased mobility;Decreased cognition;Decreased coordination;Decreased knowledge of use of DME;Decreased safety awareness;Pain       PT Treatment Interventions DME instruction;Gait training;Stair training;Therapeutic activities;Therapeutic exercise;Functional mobility training;Balance training;Neuromuscular re-education;Cognitive remediation;Patient/family education    PT Goals (Current goals can be found in the Care Plan section)  Acute Rehab PT Goals Patient Stated Goal: none stated PT Goal Formulation: Patient unable to participate in goal setting Time For Goal Achievement: 11/21/19 Potential to Achieve Goals: Fair    Frequency Min 2X/week   Barriers to discharge        Co-evaluation               AM-PAC PT "6 Clicks" Mobility  Outcome Measure Help needed turning from your back to your side while in a flat bed without using bedrails?: A Lot Help needed moving from lying on your back to sitting on the side of a flat bed without using bedrails?: A Lot Help needed moving to and from a bed to a chair (including a wheelchair)?: A Lot Help needed standing up from a chair using your arms (e.g., wheelchair or bedside chair)?: A Lot Help needed to walk in hospital room?: A Lot Help needed climbing 3-5 steps with a railing? : Total 6 Click Score: 11    End of Session Equipment Utilized During Treatment: Gait belt Activity Tolerance: Patient limited by fatigue;Patient tolerated treatment  well Patient left: with chair alarm set;with call bell/phone within reach Nurse Communication: Mobility status PT Visit Diagnosis: Muscle weakness (generalized) (M62.81);Difficulty in walking, not elsewhere classified (R26.2)    Time: 6237-6283 PT Time Calculation (min) (ACUTE ONLY): 29 min   Charges:   PT Evaluation $PT Eval Low Complexity: 1 Low PT Treatments $Therapeutic Activity: 8-22 mins        Kreg Shropshire, DPT 11/07/2019, 10:36 AM

## 2019-11-07 NOTE — TOC Progression Note (Signed)
Transition of Care [facility]) - Progression Note    Patient Details  Name: Tony Flores MRN: [id number] Date of Birth: Jul 23, 1930  Transition of Care [facility]) CM/SW Contact  Su Hilt, RN Phone Number: 11/07/2019, 12:24 PM  Clinical Narrative:    Penny Pia with [facility] called and said that they would like to set the patient up with PT at the apartment.  I explained that I am trying to reach the family to determine what the next step in the care would be, she stated that the daughter gave her permission I explained I need the Daughter's number and I have to get permission.  She stated that she would call the daughter and have the daughter call me        Expected Discharge Plan and Services                                                 Social Determinants of Health (SDOH) Interventions    Readmission Risk Interventions No flowsheet data found.

## 2019-11-07 NOTE — TOC Progression Note (Signed)
Transition of Care [facility]) - Progression Note    Patient Details  Name: Tony Flores MRN: [id number] Date of Birth: [birth year]  Transition of Care [facility]) CM/SW Contact  Su Hilt, RN Phone Number: 11/07/2019, 12:49 PM  Clinical Narrative:    Scherrie Bateman called back and confirmed that he is in fact the patient' s step son, he does have siblings however none of them have contact with the patient.  The patient does not have biological children and his spouse has passed away.  Patrick [place] being the next of kin that is known agrees to do a bed search for a SNF, I explained we would review the bed offers once obtained        Expected Discharge Plan and Services                                                 Social Determinants of Health (SDOH) Interventions    Readmission Risk Interventions No flowsheet data found.

## 2019-11-07 NOTE — ED Notes (Signed)
Patient placed on 2 liters of O2 due to low stats

## 2019-11-07 NOTE — TOC Progression Note (Signed)
Transition of Care [facility]) - Progression Note    Patient Details  Name: Tony Flores MRN: [id number] Date of Birth: January 06, 1930  Transition of Care [facility]) CM/SW Contact  Su Hilt, RN Phone Number: 11/07/2019, 11:49 AM  Clinical Narrative:    Spoke with [place], he gave me the Next of Kin for the patient as Scherrie Bateman [phone] He is not sure how they are related        Expected Discharge Plan and Services                                                 Social Determinants of Health (SDOH) Interventions    Readmission Risk Interventions No flowsheet data found.

## 2019-11-07 NOTE — ED Notes (Signed)
ED TO INPATIENT HANDOFF REPORT  ED Nurse Name and Phone #: Joelene Millin [phone]  S Name/Age/Gender Tony Flores 85 y.o. male Room/Bed: ED07A/ED07A  Code Status   Code Status: Full Code  Home/SNF/Other Home Patient oriented to: self Is this baseline? No   Triage Complete: Triage complete  Chief Complaint AMS  Triage Note Pt presents from [facility] with c/o  unwitnessed fall and altered mental status. Pt was found on floor by staff. Blood sugar 194. hx of diabetes. . Pt states he has fallen 4 times in the last week. Pt alert and oriented x2 which is not baseline according to facility. Pt remembers fall. Pt able to tell this RN year and location, but Pt stating to this RN "take this cigarette bud out of my fingers". Pt confused.    Allergies No Known Allergies  Level of Care/Admitting Diagnosis ED Disposition    ED Disposition Condition [facility]: [place] [[id number]]  Level of Care: Med-Surg [16]  Covid Evaluation: Asymptomatic Screening Protocol (No Symptoms)  Diagnosis: Acute metabolic encephalopathy [8676195]  Admitting Physician: Lenore Cordia [[id number]]  Attending Physician: Lenore Cordia [[id number]]  PT Class (Do Not Modify): Observation [104]  PT Acc Code (Do Not Modify): Observation [10022]       B Medical/Surgery History Past Medical History:  Diagnosis Date  . COPD (chronic obstructive pulmonary disease) ([place])   . Diabetes mellitus without complication ([place])   . Hypertension   . Tobacco use    History reviewed. No pertinent surgical history.   A IV Location/Drains/Wounds Patient Lines/Drains/Airways Status   Active Line/Drains/Airways    Name:   Placement date:   Placement time:   Site:   Days:   Peripheral IV 11/06/19 Right Antecubital   11/06/19    1812    Antecubital   1          Intake/Output Last 24 hours No intake or output data in the 24 hours ending 11/07/19  0504  Labs/Imaging Results for orders placed or performed during the hospital encounter of 11/06/19 (from the past 48 hour(s))  CBC with Differential     Status: Abnormal   Collection Time: 11/06/19  5:06 PM  Result Value Ref Range   WBC 10.5 4.0 - 10.5 K/uL   RBC 4.78 4.22 - 5.81 MIL/uL   Hemoglobin 14.2 13.0 - 17.0 g/dL   HCT 42.2 39.0 - 52.0 %   MCV 88.3 80.0 - 100.0 fL   MCH 29.7 26.0 - 34.0 pg   MCHC 33.6 30.0 - 36.0 g/dL   RDW 13.2 11.5 - 15.5 %   Platelets 148 (L) 150 - 400 K/uL   nRBC 0.0 0.0 - 0.2 %   Neutrophils Relative % 90 %   Neutro Abs 9.5 (H) 1.7 - 7.7 K/uL   Lymphocytes Relative 2 %   Lymphs Abs 0.2 (L) 0.7 - 4.0 K/uL   Monocytes Relative 7 %   Monocytes Absolute 0.7 0.1 - 1.0 K/uL   Eosinophils Relative 0 %   Eosinophils Absolute 0.0 0.0 - 0.5 K/uL   Basophils Relative 0 %   Basophils Absolute 0.0 0.0 - 0.1 K/uL   Immature Granulocytes 1 %   Abs Immature Granulocytes 0.06 0.00 - 0.07 K/uL    Comment: Performed at [facility], [address], [place], [place] [address]  Comprehensive metabolic panel     Status: Abnormal   Collection Time: 11/06/19  5:06 PM  Result Value Ref Range   Sodium 138 135 - 145 mmol/L   Potassium 3.1 (L) 3.5 - 5.1 mmol/L   Chloride 95 (L) 98 - 111 mmol/L   CO2 28 22 - 32 mmol/L   Glucose, Bld 188 (H) 70 - 99 mg/dL   BUN 24 (H) 8 - 23 mg/dL   Creatinine, Ser 1.610.86 0.61 - 1.24 mg/dL   Calcium 8.8 (L) 8.9 - 10.3 mg/dL   Total Protein 6.8 6.5 - 8.1 g/dL   Albumin 3.4 (L) 3.5 - 5.0 g/dL   AST 36 15 - 41 U/L   ALT 22 0 - 44 U/L   Alkaline Phosphatase 89 38 - 126 U/L   Total Bilirubin 1.8 (H) 0.3 - 1.2 mg/dL   GFR calc non Af Amer >60 >60 mL/min   GFR calc Af Amer >60 >60 mL/min   Anion gap 15 5 - 15    Comment: Performed at [facility], [address] [place], [place] [address]  Troponin I (High Sensitivity)     Status: None   Collection Time: 11/06/19  5:06 PM  Result Value Ref Range   Troponin I (High  Sensitivity) 11 <18 ng/L    Comment: (NOTE) Elevated high sensitivity troponin I (hsTnI) values and significant  changes across serial measurements may suggest ACS but many other  chronic and acute conditions are known to elevate hsTnI results.  Refer to the "Links" section for chest pain algorithms and additional  guidance. Performed at [facility], [address] [place], [place] [address]   Lactic acid, plasma     Status: Abnormal   Collection Time: 11/06/19  5:06 PM  Result Value Ref Range   Lactic Acid, Venous 2.3 (HH) 0.5 - 1.9 mmol/L    Comment: CRITICAL RESULT CALLED TO, READ BACK BY AND VERIFIED WITH ANNIE SMITH AT 1742 11/06/2019.PMF Performed at [facility], [address] [place], [place] [address]   Urine Drug Screen, Qualitative [facility] only)     Status: None   Collection Time: 11/06/19  5:06 PM  Result Value Ref Range   Tricyclic, Ur Screen NONE DETECTED NONE DETECTED   Amphetamines, Ur Screen NONE DETECTED NONE DETECTED   MDMA (Ecstasy)Ur Screen NONE DETECTED NONE DETECTED   Cocaine Metabolite,Ur [place] NONE DETECTED NONE DETECTED   Opiate, Ur Screen NONE DETECTED NONE DETECTED   Phencyclidine (PCP) Ur S NONE DETECTED NONE DETECTED   Cannabinoid 50 Ng, Ur [place] NONE DETECTED NONE DETECTED   Barbiturates, Ur Screen NONE DETECTED NONE DETECTED   Benzodiazepine, Ur Scrn NONE DETECTED NONE DETECTED   Methadone Scn, Ur NONE DETECTED NONE DETECTED    Comment: (NOTE) Tricyclics + metabolites, urine    Cutoff 1000 ng/mL Amphetamines + metabolites, urine  Cutoff 1000 ng/mL MDMA (Ecstasy), urine              Cutoff 500 ng/mL Cocaine Metabolite, urine          Cutoff 300 ng/mL Opiate + metabolites, urine        Cutoff 300 ng/mL Phencyclidine (PCP), urine         Cutoff 25 ng/mL Cannabinoid, urine                 Cutoff 50 ng/mL Barbiturates + metabolites, urine  Cutoff 200 ng/mL Benzodiazepine, urine              Cutoff 200 ng/mL Methadone, urine  Cutoff 300 ng/mL The urine drug screen provides only a preliminary, unconfirmed analytical test result and should not be used for non-medical purposes. Clinical consideration and professional judgment should be applied to any positive drug screen result due to possible interfering substances. A more specific alternate chemical method must be used in order to obtain a confirmed analytical result. Gas chromatography / mass spectrometry (GC/MS) is the preferred confirmat ory method. Performed at [facility], [address] [place], [place] [address]   Urinalysis, Complete w Microscopic     Status: Abnormal   Collection Time: 11/06/19  5:17 PM  Result Value Ref Range   Color, Urine YELLOW (A) YELLOW   APPearance HAZY (A) CLEAR   Specific Gravity, Urine 1.023 1.005 - 1.030   pH 5.0 5.0 - 8.0   Glucose, UA NEGATIVE NEGATIVE mg/dL   Hgb urine dipstick MODERATE (A) NEGATIVE   Bilirubin Urine NEGATIVE NEGATIVE   Ketones, ur 20 (A) NEGATIVE mg/dL   Protein, ur 30 (A) NEGATIVE mg/dL   Nitrite NEGATIVE NEGATIVE   Leukocytes,Ua NEGATIVE NEGATIVE   RBC / HPF 0-5 0 - 5 RBC/hpf   WBC, UA 0-5 0 - 5 WBC/hpf   Bacteria, UA RARE (A) NONE SEEN   Squamous Epithelial / LPF 0-5 0 - 5   Mucus PRESENT    Hyaline Casts, UA PRESENT     Comment: Performed at [facility], [address] [place], [place] [address]  Lactic acid, plasma     Status: None   Collection Time: 11/06/19  7:21 PM  Result Value Ref Range   Lactic Acid, Venous 1.5 0.5 - 1.9 mmol/L    Comment: Performed at [facility], [address] [place], [place] [address]  Troponin I (High Sensitivity)     Status: None   Collection Time: 11/06/19  7:21 PM  Result Value Ref Range   Troponin I (High Sensitivity) 11 <18 ng/L    Comment: (NOTE) Elevated high sensitivity troponin I (hsTnI) values and significant  changes across serial measurements may suggest ACS but many other  chronic and acute  conditions are known to elevate hsTnI results.  Refer to the "Links" section for chest pain algorithms and additional  guidance. Performed at [facility], [address] [place], [place] [address]   Magnesium     Status: None   Collection Time: 11/06/19  7:21 PM  Result Value Ref Range   Magnesium 1.8 1.7 - 2.4 mg/dL    Comment: Performed at [facility], [address] [place], [place] [address]  Phosphorus     Status: None   Collection Time: 11/06/19  7:21 PM  Result Value Ref Range   Phosphorus 3.2 2.5 - 4.6 mg/dL    Comment: Performed at [facility], [address] [place], [place] [address]  TSH     Status: None   Collection Time: 11/06/19  7:21 PM  Result Value Ref Range   TSH 0.746 0.350 - 4.500 uIU/mL    Comment: Performed by a 3rd Generation assay with a functional sensitivity of <=0.01 uIU/mL. Performed at [facility], [address] [place], [place] [address]   Hemoglobin A1c     Status: Abnormal   Collection Time: 11/06/19  7:21 PM  Result Value Ref Range   Hgb A1c MFr Bld 5.7 (H) 4.8 - 5.6 %    Comment: (NOTE) Pre diabetes:          5.7%-6.4% Diabetes:              >  6.4% Glycemic control for   <7.0% adults with diabetes    Mean Plasma Glucose 116.89 mg/dL    Comment: Performed at [facility], [address], [place], [place] [address]   Dg Lumbar Spine 2-3 Views  Addendum Date: 11/07/2019   ADDENDUM REPORT: 11/07/2019 00:14 ADDENDUM: Study discussed by telephone with Dr. Don Perking on 11/07/2019 at 0003 hours. Electronically Signed   By: Odessa Fleming M.D.   On: 11/07/2019 00:14   Result Date: 11/07/2019 CLINICAL DATA:  86 year old male with back pain after fall. EXAM: LUMBAR SPINE - 2-3 VIEW COMPARISON:  Lumbar radiographs 06/14/2009. FINDINGS: Lumbar segmentation appears normal. There is evidence of chronic ankylosis through the lower thoracic and upper lumbar spine. There is a horizontal plane fracture through the  L1-L2 disc space or lower L1 body evident on the lateral view. This is minimally displaced. Other lumbar levels appear intact. Chronic upper lumbar ankylosis likely through the L3 level is associated with bulky chronic endplate spurring at L3-L4. Probable chronic bilateral SI joint ankylosis. Extensive Aortoiliac calcified atherosclerosis. Increased gas in nondilated bowel loops. IMPRESSION: 1. Horizontally oriented acute fracture through chronically ankylosed upper lumbar spine at L1-L2. This is likely an unstable injury, and either Lumbar CT or MRI would be valuable for further characterization. Recommend spine precautions and Spine Surgery consultation. 2. Chronic spine and SI joint ankylosis suspected. 3.  Aortic Atherosclerosis (ICD10-I70.0). Electronically Signed: By: Odessa Fleming M.D. On: 11/06/2019 23:58   Dg Abdomen 1 View  Result Date: 11/07/2019 CLINICAL DATA:  MRI clearance EXAM: ABDOMEN - 1 VIEW COMPARISON:  None. FINDINGS: No visible or radiopaque foreign body. Vascular calcifications noted in the upper abdomen. Nonobstructive bowel gas pattern. No organomegaly or free air. IMPRESSION: No radiopaque foreign body. No bowel obstruction or free air. Electronically Signed   By: Charlett Nose M.D.   On: 11/07/2019 01:00   Ct Head Wo Contrast  Result Date: 11/06/2019 CLINICAL DATA:  Unwitnessed fall, altered mental status. Hyperglycemia. EXAM: CT HEAD WITHOUT CONTRAST TECHNIQUE: Contiguous axial images were obtained from the base of the skull through the vertex without intravenous contrast. COMPARISON:  None. FINDINGS: Brain: The brainstem, cerebellum, cerebral peduncles, thalami, basal ganglia, basilar cisterns, and ventricular system appear within normal limits. Periventricular white matter and corona radiata hypodensities favor chronic ischemic microvascular white matter disease. No intracranial hemorrhage, mass lesion, or acute CVA. Vascular: There is atherosclerotic calcification of the cavernous  carotid arteries bilaterally. Skull: Prominent confluence of occipital arachnoid granulations, likely incidental. Sinuses/Orbits: Chronic ethmoid, frontal, and left sphenoid sinusitis. Other: No supplemental non-categorized findings. IMPRESSION: 1. No acute intracranial findings. 2. Periventricular white matter and corona radiata hypodensities favor chronic ischemic microvascular white matter disease. 3. Chronic paranasal sinusitis. 4. Prominent confluence of occipital arachnoid granulations. Electronically Signed   By: Gaylyn Rong M.D.   On: 11/06/2019 18:15   Ct Lumbar Spine Wo Contrast  Result Date: 11/07/2019 CLINICAL DATA:  85 year old male status post fall with back pain and L1-L2 level fracture superimposed on spinal ankylosis identified radiographically earlier tonight. EXAM: CT LUMBAR SPINE WITHOUT CONTRAST TECHNIQUE: Multidetector CT imaging of the lumbar spine was performed without intravenous contrast administration. Multiplanar CT image reconstructions were also generated. COMPARISON:  Lumbar radiographs earlier tonight. FINDINGS: Segmentation: Normal. Alignment: Stable alignment. In the AP plane on coronal images on lateral images there is increased kyphosis about the L1-L2 fracture, with increased distraction along the anterior L1-L2 disc space (series 7, image 37). No spondylolisthesis. See additional details below. Vertebrae: Osteopenia with  flowing endplate osteophytes or syndesmophytes from the lower thoracic spine through L3 resulting in ankylosis. Bulky mostly anterior endplate osteophyte at L3-L4 without ankylosis. Similar L4-L5 and L5-S1 degenerative changes without ankylosis. A degree of bilateral SI joint ankylosis is noted on series 3, image 123. Horizontal fracture through the L1-L2 disc space is now mildly distracted anteriorly (up to 7 millimeters). The fracture extends to the posterior disc space, although the L1 and L2 pedicles and other posterior elements appear to remain  intact. No other fracture identified. Paraspinal and other soft tissues: There is mild ventral and mild lateral paraspinal hematoma at L1-L2 (series 4, image 32). Associated thickening of the posterior right hemidiaphragm, and small volume hematoma tracking ventral to the right psoas muscle (series 4, image 84). Aortoiliac calcified atherosclerosis. Vascular patency is not evaluated in the absence of IV contrast. Partially visible large bowel diverticulosis. Disc levels: Ankylosis from the lower thoracic spine through L3 with no evidence of spinal stenosis. L3-L4 through L5-S1 disc, endplate, and posterior element degeneration. At L4-L5 there is mild to moderate spinal stenosis with left greater than right lateral recess stenosis (series 4, image 95) and moderate to severe left L4 foraminal stenosis. IMPRESSION: 1. Osteopenia with lower thoracic through L3 ankylosis, and a degree of bilateral SI joint ankylosis. 2. Horizontal fracture through the L1-L2 disc space fracture now with mild distraction of the anterior disc space / mild kyphosis. But no spondylolisthesis, and the posterior elements at each level appear to remain intact and normally aligned. 3. Associated small volume hematoma in the paraspinal soft tissues tracking inferiorly along the right psoas muscle. 4. No other acute osseous abnormality identified. 5. Degenerative multifactorial spinal and left neural foraminal stenosis at L4-L5. 6. Aortic Atherosclerosis (ICD10-I70.0). Large bowel diverticulosis. Electronically Signed   By: Odessa Fleming M.D.   On: 11/07/2019 01:55   Dg Chest Portable 1 View  Result Date: 11/06/2019 CLINICAL DATA:  Elevated lactate unwitnessed fall EXAM: PORTABLE CHEST 1 VIEW COMPARISON:  None. FINDINGS: Streaky atelectasis or scarring at the bases. No consolidation or effusion. Cardiomediastinal silhouette within normal limits with aortic atherosclerosis. No pneumothorax. IMPRESSION: Mild scarring or atelectasis at the bases.  Electronically Signed   By: Jasmine Pang M.D.   On: 11/06/2019 19:10    Pending Labs Unresulted Labs (From admission, onward)    Start     Ordered   11/07/19 0500  CBC  Tomorrow morning,   STAT     11/06/19 2319   11/07/19 0500  Basic metabolic panel  Tomorrow morning,   STAT     11/06/19 2319   11/06/19 2321  Vitamin B12  Add-on,   AD     11/06/19 2321   11/06/19 2229  SARS CORONAVIRUS 2 (TAT 6-24 HRS) Nasopharyngeal Nasopharyngeal Swab  (Asymptomatic/Tier 2 Patients Labs)  Once,   STAT    Question Answer Comment  Is this test for diagnosis or screening Screening   Symptomatic for COVID-19 as defined by CDC No   Hospitalized for COVID-19 No   Admitted to ICU for COVID-19 No   Previously tested for COVID-19 No   Resident in a congregate (group) care setting Yes   Employed in healthcare setting No      11/06/19 2228          Vitals/Pain Today's Vitals   11/07/19 0300 11/07/19 0330 11/07/19 0400 11/07/19 0430  BP: 108/60 (!) 119/59 (!) 119/59 122/62  Pulse: 83 81 79 89  Resp:      Temp:  TempSrc:      SpO2: 100% 99% 99% 100%  Weight:      Height:      PainSc:        Isolation Precautions No active isolations  Medications Medications  enoxaparin (LOVENOX) injection 40 mg (has no administration in time range)  0.9 % NaCl with KCl 20 mEq/ L  infusion ( Intravenous New Bag/Given 11/07/19 0031)  acetaminophen (TYLENOL) tablet 650 mg (has no administration in time range)    Or  acetaminophen (TYLENOL) suppository 650 mg (has no administration in time range)  insulin aspart (novoLOG) injection 0-9 Units (has no administration in time range)  morphine 2 MG/ML injection 0.5 mg (has no administration in time range)  sodium chloride 0.9 % bolus 500 mL (0 mLs Intravenous Stopped 11/06/19 1919)  potassium chloride (KLOR-CON) packet 40 mEq (40 mEq Oral Given 11/07/19 0039)  fentaNYL (SUBLIMAZE) injection 50 mcg (50 mcg Intravenous Given 11/07/19 0138)  ondansetron  (ZOFRAN) injection 4 mg (4 mg Intravenous Given 11/07/19 0138)    Mobility non-ambulatory High fall risk   Focused Assessments Fractured in back   R Recommendations: See Admitting Provider Note  Report given to:   Additional Notes: On 2liters O2 due to O2 stats dropping into upper 80's

## 2019-11-07 NOTE — Plan of Care (Signed)
Patient's lumbar spine x-ray resulted as horizontally oriented acute fracture through chronically ankylosed upper lumbar spine L1-L2.  Patient is currently neurologically intact with normal sensation, strength, and DTRs in both legs.    I discussed with neurosurgery Dr. Lacinda Axon who is not on-call until 7 AM and recommended discussion with on-call neurosurgery at [facility].  I spoke with on-call neurosurgeon, Dr. Ellene Route, at [facility] who reviewed the case and given patient's age, associated chronic lumbar ankylosis, and reassuring physical exam that he did not anticipate taking patient emergently to the OR.  He recommended obtaining noncontrast CT scan for further evaluation of the lumbar spine, continuing spine precautions, and neurosurgery evaluation here in the morning.    Will continue with plan to admit patient to [facility].  Continue spine precautions and neurochecks.  I have canceled MRI L-spine and have ordered a CT lumbar spine per neurosurgery recommendations.  A.m. neurosurgery consultation placed via Epic.  Appreciate emergency medicine and neurosurgery assistance with care of this patient.  Zada Finders, MD [facility]

## 2019-11-07 NOTE — Consult Note (Signed)
Neurosurgery-New Consultation Evaluation 11/07/2019 Tony MalkinJames A Flores [id number]  Identifying Statement: Tony Flores is a 85 y[place]o. [place] [id number] with lumbar fracture  Physician Requesting Consultation: Tony LovettVipul Shah, MD  History of Present Illness: Tony Flores was brought to the ED on 11/15 after being found down and with AMS> He was complaining of back and buttocks pain and a CT of the lumbar spine was obtained that showed a fracture through the L1/2 disc space and ankylosed spine adjacent to it. He has a PMH for DM and COPD and still not returned to baseline mental status. He denies any obvious leg pain. He does not endorse any numbness. He is not coherent enough to tell me of any events surrounding the fall. He states he has no close family members in the state and no one to help with medical decisions.   Past Medical History:  Past Medical History:  Diagnosis Date  . COPD (chronic obstructive pulmonary disease) (HCC)   . Diabetes mellitus without complication (HCC)   . Hypertension   . Tobacco use     Social History: Social History   Socioeconomic History  . Marital status: Widowed    Spouse name: Not on file  . Number of children: Not on file  . Years of education: Not on file  . Highest education level: Not on file  Occupational History  . Not on file  Social Needs  . Financial resource strain: Not on file  . Food insecurity    Worry: Not on file    Inability: Not on file  . Transportation needs    Medical: Not on file    Non-medical: Not on file  Tobacco Use  . Smoking status: Current Every Day Smoker    Packs/day: 1.00  Substance and Sexual Activity  . Alcohol use: Not Currently  . Drug use: Never  . Sexual activity: Not on file  Lifestyle  . Physical activity    Days per week: Not on file    Minutes per session: Not on file  . Stress: Not on file  Relationships  . Social Musicianconnections    Talks on phone: Not on file    Gets together: Not on  file    Attends religious service: Not on file    Active member of club or organization: Not on file    Attends meetings of clubs or organizations: Not on file    Relationship status: Not on file  . Intimate partner violence    Fear of current or ex partner: Not on file    Emotionally abused: Not on file    Physically abused: Not on file    Forced sexual activity: Not on file  Other Topics Concern  . Not on file  Social History Narrative  . Not on file    Family History: Family History  Problem Relation Age of Onset  . Diabetes Mother   . Heart attack Mother   . Stroke Mother   . Diabetes Father   . Heart attack Father     Review of Systems:  Review of Systems - General ROS: Negative Psychological ROS: Positive for AMS Ophthalmic ROS: Negative ENT ROS: Negative Hematological and Lymphatic ROS: Negative  Endocrine ROS: Negative Respiratory ROS: Negative Cardiovascular ROS: Negative Gastrointestinal ROS: Negative Genito-Urinary ROS: Negative Musculoskeletal ROS: Positive for back pain Neurological ROS: Negative for leg numbness and weakness Dermatological ROS: Negative  Physical Exam: BP 121/80 (BP Location: Right Arm)   Pulse 82  Temp (!) 97.5 F (36.4 C) (Oral)   Resp 16   Ht  (1.778 m)   Wt 70.3 kg   SpO2 96%   BMI 22.24 kg/m  Body mass index is 22.24 kg/m. Body surface area is 1.86 meters squared. General appearance: Alert, not able to fully cooperate with exam Head: Normocephalic, atraumatic Eyes: Normal, EOM intact Back: Tenderness in lumbar region Ext: No edema in LE bilaterally, notable ecchymosis in all extremities  Neurologic exam:  Mental status: alertness: alert,  affect: blunted Speech: fluent and clear Cranial nerves:  V/VII:no evidence of facial droop or weakness  VIII: hard of hearing Motor:strength 5/5 in hip flexion, dorsiflexion, and plantarflexion bilaterally but wont perform knee movements to test strength Sensory: intact to  light touch in lower extremities Gait: not tested  Laboratory: Results for orders placed or performed during the hospital encounter of 11/06/19  SARS CORONAVIRUS 2 (TAT 6-24 HRS) Nasopharyngeal Nasopharyngeal Swab   Specimen: Nasopharyngeal Swab  Result Value Ref Range   SARS Coronavirus 2 NEGATIVE NEGATIVE  CBC with Differential  Result Value Ref Range   WBC 10.5 4.0 - 10.5 K/uL   RBC 4.78 4.22 - 5.81 MIL/uL   Hemoglobin 14.2 13.0 - 17.0 g/dL   HCT 16.1 09.6 - 04.5 %   MCV 88.3 80.0 - 100.0 fL   MCH 29.7 26.0 - 34.0 pg   MCHC 33.6 30.0 - 36.0 g/dL   RDW 40.9 81.1 - 91.4 %   Platelets 148 (L) 150 - 400 K/uL   nRBC 0.0 0.0 - 0.2 %   Neutrophils Relative % 90 %   Neutro Abs 9.5 (H) 1.7 - 7.7 K/uL   Lymphocytes Relative 2 %   Lymphs Abs 0.2 (L) 0.7 - 4.0 K/uL   Monocytes Relative 7 %   Monocytes Absolute 0.7 0.1 - 1.0 K/uL   Eosinophils Relative 0 %   Eosinophils Absolute 0.0 0.0 - 0.5 K/uL   Basophils Relative 0 %   Basophils Absolute 0.0 0.0 - 0.1 K/uL   Immature Granulocytes 1 %   Abs Immature Granulocytes 0.06 0.00 - 0.07 K/uL  Comprehensive metabolic panel  Result Value Ref Range   Sodium 138 135 - 145 mmol/L   Potassium 3.1 (L) 3.5 - 5.1 mmol/L   Chloride 95 (L) 98 - 111 mmol/L   CO2 28 22 - 32 mmol/L   Glucose, Bld 188 (H) 70 - 99 mg/dL   BUN 24 (H) 8 - 23 mg/dL   Creatinine, Ser 7.82 0.61 - 1.24 mg/dL   Calcium 8.8 (L) 8.9 - 10.3 mg/dL   Total Protein 6.8 6.5 - 8.1 g/dL   Albumin 3.4 (L) 3.5 - 5.0 g/dL   AST 36 15 - 41 U/L   ALT 22 0 - 44 U/L   Alkaline Phosphatase 89 38 - 126 U/L   Total Bilirubin 1.8 (H) 0.3 - 1.2 mg/dL   GFR calc non Af Amer >60 >60 mL/min   GFR calc Af Amer >60 >60 mL/min   Anion gap 15 5 - 15  Lactic acid, plasma  Result Value Ref Range   Lactic Acid, Venous 2.3 (HH) 0.5 - 1.9 mmol/L  Lactic acid, plasma  Result Value Ref Range   Lactic Acid, Venous 1.5 0.5 - 1.9 mmol/L  Urinalysis, Complete w Microscopic  Result Value Ref Range    Color, Urine YELLOW (A) YELLOW   APPearance HAZY (A) CLEAR   Specific Gravity, Urine 1.023 1.005 - 1.030   pH 5.0 5.0 -  8.0   Glucose, UA NEGATIVE NEGATIVE mg/dL   Hgb urine dipstick MODERATE (A) NEGATIVE   Bilirubin Urine NEGATIVE NEGATIVE   Ketones, ur 20 (A) NEGATIVE mg/dL   Protein, ur 30 (A) NEGATIVE mg/dL   Nitrite NEGATIVE NEGATIVE   Leukocytes,Ua NEGATIVE NEGATIVE   RBC / HPF 0-5 0 - 5 RBC/hpf   WBC, UA 0-5 0 - 5 WBC/hpf   Bacteria, UA RARE (A) NONE SEEN   Squamous Epithelial / LPF 0-5 0 - 5   Mucus PRESENT    Hyaline Casts, UA PRESENT   Magnesium  Result Value Ref Range   Magnesium 1.8 1.7 - 2.4 mg/dL  Phosphorus  Result Value Ref Range   Phosphorus 3.2 2.5 - 4.6 mg/dL  TSH  Result Value Ref Range   TSH 0.746 0.350 - 4.500 uIU/mL  CBC  Result Value Ref Range   WBC 9.0 4.0 - 10.5 K/uL   RBC 4.44 4.22 - 5.81 MIL/uL   Hemoglobin 13.1 13.0 - 17.0 g/dL   HCT 38.4 (L) 39.0 - 52.0 %   MCV 86.5 80.0 - 100.0 fL   MCH 29.5 26.0 - 34.0 pg   MCHC 34.1 30.0 - 36.0 g/dL   RDW 13.2 11.5 - 15.5 %   Platelets 145 (L) 150 - 400 K/uL   nRBC 0.0 0.0 - 0.2 %  Basic metabolic panel  Result Value Ref Range   Sodium 142 135 - 145 mmol/L   Potassium 3.5 3.5 - 5.1 mmol/L   Chloride 103 98 - 111 mmol/L   CO2 26 22 - 32 mmol/L   Glucose, Bld 165 (H) 70 - 99 mg/dL   BUN 25 (H) 8 - 23 mg/dL   Creatinine, Ser 0.62 0.61 - 1.24 mg/dL   Calcium 8.3 (L) 8.9 - 10.3 mg/dL   GFR calc non Af Amer >60 >60 mL/min   GFR calc Af Amer >60 >60 mL/min   Anion gap 13 5 - 15  Hemoglobin A1c  Result Value Ref Range   Hgb A1c MFr Bld 5.7 (H) 4.8 - 5.6 %   Mean Plasma Glucose 116.89 mg/dL  Vitamin B12  Result Value Ref Range   Vitamin B-12 120 (L) 180 - 914 pg/mL  Urine Drug Screen, Qualitative ([facility] only)  Result Value Ref Range   Tricyclic, Ur Screen NONE DETECTED NONE DETECTED   Amphetamines, Ur Screen NONE DETECTED NONE DETECTED   MDMA (Ecstasy)Ur Screen NONE DETECTED NONE DETECTED   Cocaine  Metabolite,Ur [place] NONE DETECTED NONE DETECTED   Opiate, Ur Screen NONE DETECTED NONE DETECTED   Phencyclidine (PCP) Ur S NONE DETECTED NONE DETECTED   Cannabinoid 50 Ng, Ur [place] NONE DETECTED NONE DETECTED   Barbiturates, Ur Screen NONE DETECTED NONE DETECTED   Benzodiazepine, Ur Scrn NONE DETECTED NONE DETECTED   Methadone Scn, Ur NONE DETECTED NONE DETECTED  Glucose, capillary  Result Value Ref Range   Glucose-Capillary 144 (H) 70 - 99 mg/dL   Comment 1 Notify RN   Glucose, capillary  Result Value Ref Range   Glucose-Capillary 155 (H) 70 - 99 mg/dL   Comment 1 Notify RN   Glucose, capillary  Result Value Ref Range   Glucose-Capillary 102 (H) 70 - 99 mg/dL   Comment 1 Notify RN   Troponin I (High Sensitivity)  Result Value Ref Range   Troponin I (High Sensitivity) 11 <18 ng/L  Troponin I (High Sensitivity)  Result Value Ref Range   Troponin I (High Sensitivity) 11 <18 ng/L   I  personally reviewed labs  Imaging: CT Lumbar Spine:  1. Osteopenia with lower thoracic through L3 ankylosis, and a degree of bilateral SI joint ankylosis. 2. Horizontal fracture through the L1-L2 disc space fracture now with mild distraction of the anterior disc space / mild kyphosis. But no spondylolisthesis, and the posterior elements at each level  appear to remain intact and normally aligned. 3. Associated small volume hematoma in the paraspinal soft tissues tracking inferiorly along the right psoas muscle. 4. No other acute osseous abnormality identified. 5. Degenerative multifactorial spinal and left neural foraminal stenosis at L4-L5. 6. Aortic Atherosclerosis (ICD10-I70.0). Large bowel diverticulosis.   Impression/Plan:  Tony Flores is here with a likely fall and AMS resulting in a fracture at L1/2 that does not appear stable. He has some ALL disruption but no listhesis and neurologic exam is reassuring. I tried to discuss with him the possibility of surgery and he states he would want to avoid if  possible but conservative therapy has a high rate of failure given his bone quality and adjacent ankylosed spine. However, surgery would likely consist of a T10- L3 fusion which does carry some risks. For now, I recommend a TLSO brace and spine precautions. We will likely need either family or pscyhiatry help as I am not sure he has decision making capacity.    He is OK for a diet and DVT prophylaxis at this time.   1.  Diagnosis: L1/2 fracture  - TLSO brace and spine precautions at this time. Possible surgery pending further discussions of capacity  2.  Plan

## 2019-11-07 NOTE — TOC Progression Note (Signed)
Transition of Care [facility]) - Progression Note    Patient Details  Name: Tony Flores MRN: [id number] Date of Birth: October 25, 1930  Transition of Care [facility]) CM/SW Contact  Su Hilt, RN Phone Number: 11/07/2019, 11:55 AM  Clinical Narrative:     Daisey Must [phone] left a general VM requesting a call back and provided my contact information       Expected Discharge Plan and Services                                                 Social Determinants of Health (SDOH) Interventions    Readmission Risk Interventions No flowsheet data found.

## 2019-11-07 NOTE — TOC Progression Note (Signed)
Transition of Care [facility]) - Progression Note    Patient Details  Name: MARICO BUCKLE MRN: [id number] Date of Birth: 1930-01-04  Transition of Care [facility]) CM/SW Contact  Su Hilt, RN Phone Number: 11/07/2019, 2:51 PM  Clinical Narrative:     Received Insurance information and faxed to Admissions to add to the system       Expected Discharge Plan and Services                                                 Social Determinants of Health (SDOH) Interventions    Readmission Risk Interventions No flowsheet data found.

## 2019-11-07 NOTE — TOC Progression Note (Signed)
Transition of Care [facility]) - Progression Note    Patient Details  Name: Tony Flores MRN: [id number] Date of Birth: 20-Jan-1930  Transition of Care [facility]) CM/SW Contact  Su Hilt, RN Phone Number: 11/07/2019, 2:30 PM  Clinical Narrative:    [place] and they no longer manage the patient's insurance.   They do not have the Medicare number on file I called [facility] and spoke with Cyril Mourning, she will check the patient's belonging and see if his Medicare card is in it.  She will call me back        Expected Discharge Plan and Services                                                 Social Determinants of Health (SDOH) Interventions    Readmission Risk Interventions No flowsheet data found.

## 2019-11-07 NOTE — TOC Progression Note (Signed)
Transition of Care [facility]) - Progression Note    Patient Details  Name: Tony Flores MRN: [id number] Date of Birth: 1930/09/05  Transition of Care [facility]) CM/SW Contact  Su Hilt, RN Phone Number: 11/07/2019, 1:10 PM  Clinical Narrative:    Larene Beach from [facility] called back and provided the patient's brother's phone number as another next of kin the brothers name is [name] [phone], Larene Beach will email me the patient's [place] insurance information        Expected Discharge Plan and Services                                                 Social Determinants of Health (SDOH) Interventions    Readmission Risk Interventions No flowsheet data found.

## 2019-11-07 NOTE — ED Notes (Signed)
Patient is lying on right side. Patient remained calm while blood pressure cuff was adjusted and moved arm on command.

## 2019-11-07 NOTE — TOC Progression Note (Signed)
Transition of Care [facility]) - Progression Note    Patient Details  Name: Tony TEASDALE MRN: [id number] Date of Birth: 10-14-1930  Transition of Care [facility]) CM/SW Contact  Su Hilt, RN Phone Number: 11/07/2019, 2:20 PM  Clinical Narrative:     Called to speak to [name] the patient's brother at [phone].  I was unable to reach him and left a VM requesting a call back.  Larene Beach with [facility] has not emailed the insurance information yet       Expected Discharge Plan and Services                                                 Social Determinants of Health (SDOH) Interventions    Readmission Risk Interventions No flowsheet data found.

## 2019-11-07 NOTE — Progress Notes (Signed)
[place] [facility] [place] NAME: Tony Flores    MR#:  [id number]  DATE OF BIRTH:  12/15/1930  SUBJECTIVE:  CHIEF COMPLAINT:   Chief Complaint  Patient presents with   Altered Mental Status  Pleasantly confused. REVIEW OF SYSTEMS:  ROSunable to obtain due to mental status DRUG ALLERGIES:  No Known Allergies VITALS:  Blood pressure 136/87, pulse 77, temperature 98.2 F (36.8 C), temperature source Oral, resp. rate 16, height 5\' 10"  (1.778 m), weight 70.3 kg, SpO2 98 %. PHYSICAL EXAMINATION:  Physical Exam HENT:     Head: Normocephalic and atraumatic.  Eyes:     Conjunctiva/sclera: Conjunctivae normal.     Pupils: Pupils are equal, round, and reactive to light.  Neck:     Musculoskeletal: Normal range of motion and neck supple.     Thyroid: No thyromegaly.     Trachea: No tracheal deviation.  Cardiovascular:     Rate and Rhythm: Normal rate and regular rhythm.     Heart sounds: Normal heart sounds.  Pulmonary:     Effort: Pulmonary effort is normal. No respiratory distress.     Breath sounds: Normal breath sounds. No wheezing.  Chest:     Chest wall: No tenderness.  Abdominal:     General: Bowel sounds are normal. There is no distension.     Palpations: Abdomen is soft.     Tenderness: There is no abdominal tenderness.  Musculoskeletal: Normal range of motion.  Skin:    General: Skin is warm and dry.     Findings: No rash.  Neurological:     Mental Status: He is alert. He is confused.     Cranial Nerves: No cranial nerve deficit.    LABORATORY PANEL:  Male CBC Recent Labs  Lab 11/07/19 0632  WBC 9.0  HGB 13.1  HCT 38.4*  PLT 145*   ------------------------------------------------------------------------------------------------------------------ Chemistries  Recent Labs  Lab 11/06/19 1706 11/06/19 1921 11/07/19 0632  NA 138  --  142  K 3.1*  --  3.5  CL 95*  --  103  CO2 28  --  26  GLUCOSE 188*  --  165*  BUN 24*  --  25*    CREATININE 0.86  --  0.62  CALCIUM 8.8*  --  8.3*  MG  --  1.8  --   AST 36  --   --   ALT 22  --   --   ALKPHOS 89  --   --   BILITOT 1.8*  --   --    RADIOLOGY:  Dg Lumbar Spine 2-3 Views  Addendum Date: 11/07/2019   ADDENDUM REPORT: 11/07/2019 00:14 ADDENDUM: Study discussed by telephone with Dr. Alfred Levins on 11/07/2019 at 0003 hours. Electronically Signed   By: Genevie Ann M.D.   On: 11/07/2019 00:14   Result Date: 11/07/2019 CLINICAL DATA:  85 year old male with back pain after fall. EXAM: LUMBAR SPINE - 2-3 VIEW COMPARISON:  Lumbar radiographs 06/14/2009. FINDINGS: Lumbar segmentation appears normal. There is evidence of chronic ankylosis through the lower thoracic and upper lumbar spine. There is a horizontal plane fracture through the L1-L2 disc space or lower L1 body evident on the lateral view. This is minimally displaced. Other lumbar levels appear intact. Chronic upper lumbar ankylosis likely through the L3 level is associated with bulky chronic endplate spurring at B0-F7. Probable chronic bilateral SI joint ankylosis. Extensive Aortoiliac calcified atherosclerosis. Increased gas in nondilated bowel loops. IMPRESSION: 1. Horizontally oriented acute fracture through  chronically ankylosed upper lumbar spine at L1-L2. This is likely an unstable injury, and either Lumbar CT or MRI would be valuable for further characterization. Recommend spine precautions and Spine Surgery consultation. 2. Chronic spine and SI joint ankylosis suspected. 3.  Aortic Atherosclerosis (ICD10-I70.0). Electronically Signed: By: Odessa FlemingH  Hall M.D. On: 11/06/2019 23:58   Dg Abdomen 1 View  Result Date: 11/07/2019 CLINICAL DATA:  MRI clearance EXAM: ABDOMEN - 1 VIEW COMPARISON:  None. FINDINGS: No visible or radiopaque foreign body. Vascular calcifications noted in the upper abdomen. Nonobstructive bowel gas pattern. No organomegaly or free air. IMPRESSION: No radiopaque foreign body. No bowel obstruction or free air.  Electronically Signed   By: Charlett NoseKevin  Dover M.D.   On: 11/07/2019 01:00   Ct Lumbar Spine Wo Contrast  Result Date: 11/07/2019 CLINICAL DATA:  85 year old male status post fall with back pain and L1-L2 level fracture superimposed on spinal ankylosis identified radiographically earlier tonight. EXAM: CT LUMBAR SPINE WITHOUT CONTRAST TECHNIQUE: Multidetector CT imaging of the lumbar spine was performed without intravenous contrast administration. Multiplanar CT image reconstructions were also generated. COMPARISON:  Lumbar radiographs earlier tonight. FINDINGS: Segmentation: Normal. Alignment: Stable alignment. In the AP plane on coronal images on lateral images there is increased kyphosis about the L1-L2 fracture, with increased distraction along the anterior L1-L2 disc space (series 7, image 37). No spondylolisthesis. See additional details below. Vertebrae: Osteopenia with flowing endplate osteophytes or syndesmophytes from the lower thoracic spine through L3 resulting in ankylosis. Bulky mostly anterior endplate osteophyte at L3-L4 without ankylosis. Similar L4-L5 and L5-S1 degenerative changes without ankylosis. A degree of bilateral SI joint ankylosis is noted on series 3, image 123. Horizontal fracture through the L1-L2 disc space is now mildly distracted anteriorly (up to 7 millimeters). The fracture extends to the posterior disc space, although the L1 and L2 pedicles and other posterior elements appear to remain intact. No other fracture identified. Paraspinal and other soft tissues: There is mild ventral and mild lateral paraspinal hematoma at L1-L2 (series 4, image 32). Associated thickening of the posterior right hemidiaphragm, and small volume hematoma tracking ventral to the right psoas muscle (series 4, image 84). Aortoiliac calcified atherosclerosis. Vascular patency is not evaluated in the absence of IV contrast. Partially visible large bowel diverticulosis. Disc levels: Ankylosis from the lower  thoracic spine through L3 with no evidence of spinal stenosis. L3-L4 through L5-S1 disc, endplate, and posterior element degeneration. At L4-L5 there is mild to moderate spinal stenosis with left greater than right lateral recess stenosis (series 4, image 95) and moderate to severe left L4 foraminal stenosis. IMPRESSION: 1. Osteopenia with lower thoracic through L3 ankylosis, and a degree of bilateral SI joint ankylosis. 2. Horizontal fracture through the L1-L2 disc space fracture now with mild distraction of the anterior disc space / mild kyphosis. But no spondylolisthesis, and the posterior elements at each level appear to remain intact and normally aligned. 3. Associated small volume hematoma in the paraspinal soft tissues tracking inferiorly along the right psoas muscle. 4. No other acute osseous abnormality identified. 5. Degenerative multifactorial spinal and left neural foraminal stenosis at L4-L5. 6. Aortic Atherosclerosis (ICD10-I70.0). Large bowel diverticulosis. Electronically Signed   By: Odessa FlemingH  Hall M.D.   On: 11/07/2019 01:55   ASSESSMENT AND PLAN:  Towanda MalkinJames A Wachtel is a 85 y.o. male with medical history significant for type 2 diabetes, hypertension, COPD, and tobacco use who is admitted with acute encephalopathy with reported falls at his independent living facility.  Acute encephalopathy: -  likely multifactorial - metabolic with underlying cognitive impairment.  No obvious infectious cause.  He is dehydrated on examination.  CT head is without acute findings, does show chronic ischemic microvascular white matter disease. -Continue IV fluid resuscitation overnight -normal TSH, low B12, UDS neg -Unable to contact any family or his independent living facility, he is unsafe to be discharged home in his current state - CSW aware and working on placement (PT/OT recommends SNF)  * L1-L2 disc space fracture s/p falls Reported multiple falls over the last week without obvious significant injury.   Possibly related to dehydration.  He is complaining of lower back pain. -Continue IV fluid resuscitation as above -Hold home HCTZ -PT/OT recommends STR/SNF -Fall precautions -CT lumbar spine shows L1-L2 disc space fracture - appreciate Neuro surgery input, poor operative candidate - TLSO for now, CSW trying to reach family but none  - pain mgmt as need  Hypokalemia: Replete and recheck.  Holding HCTZ for now.  COPD: Chronic and stable without active wheezing or hypoxia.  Not requiring maintenance therapy as an outpatient.  Continue monitor.  Hypertension: Currently normotensive.  Holding HCTZ as above, hold atenolol for now.  Type 2 diabetes: On Metformin and Amaryl as an outpatient.  sensitive SSI while in hospital.  Tobacco use: Reports ongoing tobacco use and smoking 1 pack/day     All the records are reviewed and case discussed with Care Management/Social Worker. Management plans discussed with the patient, nursing and they are in agreement.  CODE STATUS: Full Code  TOTAL TIME TAKING CARE OF THIS PATIENT: 35 minutes.   More than 50% of the time was spent in counseling/coordination of care: YES  POSSIBLE D/C IN 1-2 DAYS, DEPENDING ON CLINICAL CONDITION.   Delfino Lovett M.D on 11/07/2019 at 7:50 PM  Between 7am to 6pm - Pager - [phone]  After 6pm go to [email or web address] - password [email or web address]  [facility]   CC: Primary care physician; Patient, No Pcp Per  Note: This dictation was prepared with Dragon dictation along with smaller phrase technology. Any transcriptional errors that result from this process are unintentional.

## 2019-11-07 NOTE — ED Provider Notes (Signed)
_________________________ 12:29 AM on 11/07/2019 -----------------------------------------  Received a phone call from Radiologist that patient's lumbar XR was positive for horizontally oriented acute fracture at L1 and L2.  Radiology recommended MRI of the lumbar spine. Patient has been admitted to the Hospitalist service but is still in the ER awaiting a bed. Therefore I evaluated him who complains of back pain. He has bruising to his back. He is neurologically intact at this time with normal strength, sensation, with 1+ bilateral patella DTRs. I ordered the MRI. Discussed this finding and recommendations with the Hospitalist Dr. Zada Finders who will consult neurosurgery. Patient has been placed on spine precautions. Will give fentanyl for pain.      I have personally reviewed the images performed during this visit and I agree with the Radiologist's read.   Interpretation by Radiologist:  Dg Lumbar Spine 2-3 Views  Addendum Date: 11/07/2019   ADDENDUM REPORT: 11/07/2019 00:14 ADDENDUM: Study discussed by telephone with Dr. Alfred Levins on 11/07/2019 at 0003 hours. Electronically Signed   By: Genevie Ann M.D.   On: 11/07/2019 00:14   Result Date: 11/07/2019 CLINICAL DATA:  85 year old male with back pain after fall. EXAM: LUMBAR SPINE - 2-3 VIEW COMPARISON:  Lumbar radiographs 06/14/2009. FINDINGS: Lumbar segmentation appears normal. There is evidence of chronic ankylosis through the lower thoracic and upper lumbar spine. There is a horizontal plane fracture through the L1-L2 disc space or lower L1 body evident on the lateral view. This is minimally displaced. Other lumbar levels appear intact. Chronic upper lumbar ankylosis likely through the L3 level is associated with bulky chronic endplate spurring at H9-Q2. Probable chronic bilateral SI joint ankylosis. Extensive Aortoiliac calcified atherosclerosis. Increased gas in nondilated bowel loops. IMPRESSION: 1. Horizontally oriented acute fracture  through chronically ankylosed upper lumbar spine at L1-L2. This is likely an unstable injury, and either Lumbar CT or MRI would be valuable for further characterization. Recommend spine precautions and Spine Surgery consultation. 2. Chronic spine and SI joint ankylosis suspected. 3.  Aortic Atherosclerosis (ICD10-I70.0). Electronically Signed: By: Genevie Ann M.D. On: 11/06/2019 23:58   Ct Head Wo Contrast  Result Date: 11/06/2019 CLINICAL DATA:  Unwitnessed fall, altered mental status. Hyperglycemia. EXAM: CT HEAD WITHOUT CONTRAST TECHNIQUE: Contiguous axial images were obtained from the base of the skull through the vertex without intravenous contrast. COMPARISON:  None. FINDINGS: Brain: The brainstem, cerebellum, cerebral peduncles, thalami, basal ganglia, basilar cisterns, and ventricular system appear within normal limits. Periventricular white matter and corona radiata hypodensities favor chronic ischemic microvascular white matter disease. No intracranial hemorrhage, mass lesion, or acute CVA. Vascular: There is atherosclerotic calcification of the cavernous carotid arteries bilaterally. Skull: Prominent confluence of occipital arachnoid granulations, likely incidental. Sinuses/Orbits: Chronic ethmoid, frontal, and left sphenoid sinusitis. Other: No supplemental non-categorized findings. IMPRESSION: 1. No acute intracranial findings. 2. Periventricular white matter and corona radiata hypodensities favor chronic ischemic microvascular white matter disease. 3. Chronic paranasal sinusitis. 4. Prominent confluence of occipital arachnoid granulations. Electronically Signed   By: Van Clines M.D.   On: 11/06/2019 18:15   Dg Chest Portable 1 View  Result Date: 11/06/2019 CLINICAL DATA:  Elevated lactate unwitnessed fall EXAM: PORTABLE CHEST 1 VIEW COMPARISON:  None. FINDINGS: Streaky atelectasis or scarring at the bases. No consolidation or effusion. Cardiomediastinal silhouette within normal limits  with aortic atherosclerosis. No pneumothorax. IMPRESSION: Mild scarring or atelectasis at the bases. Electronically Signed   By: Donavan Foil M.D.   On: 11/06/2019 19:10      Rudene Re, MD  11/07/19 0439  

## 2019-11-08 ENCOUNTER — Other Ambulatory Visit: Payer: Self-pay | Admitting: Neurosurgery

## 2019-11-08 ENCOUNTER — Inpatient Hospital Stay: Payer: Medicare HMO

## 2019-11-08 DIAGNOSIS — M545 Low back pain, unspecified: Secondary | ICD-10-CM

## 2019-11-08 LAB — CBC
HCT: 37.2 % — ABNORMAL LOW (ref 39.0–52.0)
Hemoglobin: 12 g/dL — ABNORMAL LOW (ref 13.0–17.0)
MCH: 29.5 pg (ref 26.0–34.0)
MCHC: 32.3 g/dL (ref 30.0–36.0)
MCV: 91.4 fL (ref 80.0–100.0)
Platelets: 143 10*3/uL — ABNORMAL LOW (ref 150–400)
RBC: 4.07 MIL/uL — ABNORMAL LOW (ref 4.22–5.81)
RDW: 13.4 % (ref 11.5–15.5)
WBC: 5.7 10*3/uL (ref 4.0–10.5)
nRBC: 0 % (ref 0.0–0.2)

## 2019-11-08 LAB — URINALYSIS, ROUTINE W REFLEX MICROSCOPIC
Bilirubin Urine: NEGATIVE
Glucose, UA: NEGATIVE mg/dL
Hgb urine dipstick: NEGATIVE
Ketones, ur: 5 mg/dL — AB
Leukocytes,Ua: NEGATIVE
Nitrite: NEGATIVE
Protein, ur: NEGATIVE mg/dL
Specific Gravity, Urine: 1.018 (ref 1.005–1.030)
pH: 5 (ref 5.0–8.0)

## 2019-11-08 LAB — BASIC METABOLIC PANEL
Anion gap: 10 (ref 5–15)
BUN: 21 mg/dL (ref 8–23)
CO2: 27 mmol/L (ref 22–32)
Calcium: 8.2 mg/dL — ABNORMAL LOW (ref 8.9–10.3)
Chloride: 106 mmol/L (ref 98–111)
Creatinine, Ser: 0.68 mg/dL (ref 0.61–1.24)
GFR calc Af Amer: >60 mL/min (ref >60)
GFR calc non Af Amer: >60 mL/min (ref >60)
Glucose, Bld: 96 mg/dL (ref 70–99)
Potassium: 3.7 mmol/L (ref 3.5–5.1)
Sodium: 143 mmol/L (ref 135–145)

## 2019-11-08 LAB — GLUCOSE, CAPILLARY
Glucose-Capillary: 128 mg/dL — ABNORMAL HIGH (ref 70–99)
Glucose-Capillary: 155 mg/dL — ABNORMAL HIGH (ref 70–99)
Glucose-Capillary: 80 mg/dL (ref 70–99)
Glucose-Capillary: 92 mg/dL (ref 70–99)
Glucose-Capillary: 94 mg/dL (ref 70–99)

## 2019-11-08 NOTE — Progress Notes (Signed)
PT Cancellation Note  Patient Details Name: Tony Flores MRN: [id number] DOB: September 07, 1930   Cancelled Treatment:    Reason Eval/Treat Not Completed: Patient not medically ready  TLSO not yet on site, still deciding on surgery.  Will hold PT this date and reassess as appropriate.  Kreg Shropshire, DPT 11/08/2019, 3:25 PM

## 2019-11-08 NOTE — TOC Progression Note (Signed)
Transition of Care [facility]) - Progression Note    Patient Details  Name: Tony Flores MRN: [id number] Date of Birth: [birth year]  Transition of Care [facility]) CM/SW Contact  Su Hilt, RN Phone Number: 11/08/2019, 3:48 PM  Clinical Narrative:    Tony Flores with Scherrie Bateman the patient's step son and he stated that he has spoken with Dr [facility] but is waiting on Dr Lacinda Axon the neurosurgeon to call him.          Expected Discharge Plan and Services                                                 Social Determinants of Health (SDOH) Interventions    Readmission Risk Interventions No flowsheet data found.

## 2019-11-08 NOTE — Progress Notes (Signed)
[place] at [place] NAME: Tony Flores    MR#:  [id number]  DATE OF BIRTH:  Feb 16, 1930  SUBJECTIVE:  CHIEF COMPLAINT:   Chief Complaint  Patient presents with  . Altered Mental Status  Pleasantly confused. No new c/o - requests to call step-son REVIEW OF SYSTEMS:  ROSunable to obtain due to mental status DRUG ALLERGIES:  No Known Allergies VITALS:  Blood pressure (!) 127/92, pulse 80, temperature 98.6 F (37 C), temperature source Oral, resp. rate 18, height 5\' 10"  (1.778 m), weight 70.3 kg, SpO2 95 %. PHYSICAL EXAMINATION:  Physical Exam HENT:     Head: Normocephalic and atraumatic.  Eyes:     Conjunctiva/sclera: Conjunctivae normal.     Pupils: Pupils are equal, round, and reactive to light.  Neck:     Musculoskeletal: Normal range of motion and neck supple.     Thyroid: No thyromegaly.     Trachea: No tracheal deviation.  Cardiovascular:     Rate and Rhythm: Normal rate and regular rhythm.     Heart sounds: Normal heart sounds.  Pulmonary:     Effort: Pulmonary effort is normal. No respiratory distress.     Breath sounds: Normal breath sounds. No wheezing.  Chest:     Chest wall: No tenderness.  Abdominal:     General: Bowel sounds are normal. There is no distension.     Palpations: Abdomen is soft.     Tenderness: There is no abdominal tenderness.  Musculoskeletal: Normal range of motion.  Skin:    General: Skin is warm and dry.     Findings: No rash.  Neurological:     Mental Status: He is alert. He is confused.     Cranial Nerves: No cranial nerve deficit.    LABORATORY PANEL:  Male CBC Recent Labs  Lab 11/08/19 0519  WBC 5.7  HGB 12.0*  HCT 37.2*  PLT 143*   ------------------------------------------------------------------------------------------------------------------ Chemistries  Recent Labs  Lab 11/06/19 1706 11/06/19 1921  11/08/19 0519  NA 138  --    < > 143  K 3.1*  --    < > 3.7  CL 95*  --    < > 106   CO2 28  --    < > 27  GLUCOSE 188*  --    < > 96  BUN 24*  --    < > 21  CREATININE 0.86  --    < > 0.68  CALCIUM 8.8*  --    < > 8.2*  MG  --  1.8  --   --   AST 36  --   --   --   ALT 22  --   --   --   ALKPHOS 89  --   --   --   BILITOT 1.8*  --   --   --    < > = values in this interval not displayed.   RADIOLOGY:  No results found. ASSESSMENT AND PLAN:  Tony Flores is a 85 y.o. male with medical history significant for type 2 diabetes, hypertension, COPD, and tobacco use who is admitted with acute encephalopathy with reported falls at his independent living facility.  Acute encephalopathy: - likely multifactorial - metabolic with underlying cognitive impairment.  No obvious infectious cause.  He is dehydrated on examination.  CT head is without acute findings, does show chronic ischemic microvascular white matter disease. -Continue IV fluid resuscitation overnight -normal TSH, low B12, UDS  neg -Unable to contact any family or his independent living facility, he is unsafe to be discharged home in his current state - CSW aware and working on placement (PT/OT recommends SNF)  * L1-L2 disc space fracture s/p falls Reported multiple falls over the last week without obvious significant injury.  Possibly related to dehydration.  He is complaining of lower back pain. -Continue IV fluid resuscitation as above -Hold home HCTZ -PT/OT recommends STR/SNF -Fall precautions -CT lumbar spine shows L1-L2 disc space fracture - appreciate Neuro surgery input, poor operative candidate - TLSO for now, I was able to talk with Aram Beecham at [phone] and he is leaning towards surgery although he would like to discuss with neurosurgery. I've sent message to Dr Adriana Simas to call him - pain mgmt as need  Hypokalemia: Repleted & resolved  COPD: Chronic and stable without active wheezing or hypoxia.  Not requiring maintenance therapy as an outpatient.  Continue monitor.  Hypertension:  Currently normotensive.  Holding HCTZ as above, hold atenolol for now.  Type 2 diabetes: On Metformin and Amaryl as an outpatient.  sensitive SSI while in hospital.  Tobacco use: Reports ongoing tobacco use and smoking 1 pack/day     All the records are reviewed and case discussed with Care Management/Social Worker. Management plans discussed with the patient, nursing and they are in agreement.  CODE STATUS: Full Code  TOTAL TIME TAKING CARE OF THIS PATIENT: 35 minutes.   More than 50% of the time was spent in counseling/coordination of care: YES  POSSIBLE D/C IN 2-3 DAYS, DEPENDING ON CLINICAL CONDITION.   Tony Flores M.D on 11/08/2019 at 3:23 PM  Between 7am to 6pm - Pager - [phone]  After 6pm go to [email or web address] - password [email or web address]  [facility]   CC: Primary care physician; Patient, No Pcp Per  Note: This dictation was prepared with Dragon dictation along with smaller phrase technology. Any transcriptional errors that result from this process are unintentional.

## 2019-11-08 NOTE — TOC Progression Note (Signed)
Transition of Care [facility]) - Progression Note    Patient Details  Name: JANTZ MAIN MRN: [id number] Date of Birth: [birth year]  Transition of Care [facility]) CM/SW [place], RN Phone Number: 11/08/2019, 12:07 PM  Clinical Narrative:    Damaris Schooner with Scherrie Bateman the step son, He has tried to call Dr Manuella Ghazi back and was not successful He said that Dr Manuella Ghazi can call him back at the number [phone].  I secure chat messaged Dr Manuella Ghazi and provided this information Patrick [facility] has accepted the bed offer from [facility] for the patient to DC to after surgery        Expected Discharge Plan and Services                                                 Social Determinants of Health (SDOH) Interventions    Readmission Risk Interventions No flowsheet data found.

## 2019-11-08 NOTE — Progress Notes (Signed)
Orthopedic Tech Progress Note Patient Details:  ZENON LEAF Jun 06, 1930 [id number] Just received a called regarding a brace order that was put in at 0809 am. Call in to  [facility] for a TLSO  Patient ID: [name], male   DOB: [birth year], 85 y.o.   MRN: [id number]   Janit Pagan 11/08/2019, 3:38 PM

## 2019-11-09 DIAGNOSIS — Z0279 Encounter for issue of other medical certificate: Secondary | ICD-10-CM

## 2019-11-09 DIAGNOSIS — Z0189 Encounter for other specified special examinations: Secondary | ICD-10-CM

## 2019-11-09 LAB — URINE CULTURE

## 2019-11-09 LAB — BASIC METABOLIC PANEL
Anion gap: 10 (ref 5–15)
BUN: 21 mg/dL (ref 8–23)
CO2: 25 mmol/L (ref 22–32)
Calcium: 8.3 mg/dL — ABNORMAL LOW (ref 8.9–10.3)
Chloride: 105 mmol/L (ref 98–111)
Creatinine, Ser: 0.59 mg/dL — ABNORMAL LOW (ref 0.61–1.24)
GFR calc Af Amer: >60 mL/min (ref >60)
GFR calc non Af Amer: >60 mL/min (ref >60)
Glucose, Bld: 151 mg/dL — ABNORMAL HIGH (ref 70–99)
Potassium: 3.6 mmol/L (ref 3.5–5.1)
Sodium: 140 mmol/L (ref 135–145)

## 2019-11-09 LAB — CBC
HCT: 35.9 % — ABNORMAL LOW (ref 39.0–52.0)
Hemoglobin: 12.1 g/dL — ABNORMAL LOW (ref 13.0–17.0)
MCH: 29.5 pg (ref 26.0–34.0)
MCHC: 33.7 g/dL (ref 30.0–36.0)
MCV: 87.6 fL (ref 80.0–100.0)
Platelets: 158 10*3/uL (ref 150–400)
RBC: 4.1 MIL/uL — ABNORMAL LOW (ref 4.22–5.81)
RDW: 13.2 % (ref 11.5–15.5)
WBC: 6.1 10*3/uL (ref 4.0–10.5)
nRBC: 0 % (ref 0.0–0.2)

## 2019-11-09 LAB — GLUCOSE, CAPILLARY
Glucose-Capillary: 133 mg/dL — ABNORMAL HIGH (ref 70–99)
Glucose-Capillary: 144 mg/dL — ABNORMAL HIGH (ref 70–99)
Glucose-Capillary: 126 mg/dL — ABNORMAL HIGH (ref 70–99)
Glucose-Capillary: 119 mg/dL — ABNORMAL HIGH (ref 70–99)

## 2019-11-09 LAB — TYPE AND SCREEN
ABO/RH(D): O NEG
Antibody Screen: NEGATIVE

## 2019-11-09 MED ORDER — CEFAZOLIN SODIUM-DEXTROSE 2-4 GM/100ML-% IV SOLN
2.0000 g | Freq: Once | INTRAVENOUS | Status: AC
  Administered 2019-11-14: 11:00:00 2 g via INTRAVENOUS
  Filled 2019-11-09: qty 100

## 2019-11-09 MED ORDER — KETOTIFEN FUMARATE 0.025 % OP SOLN
1.0000 [drp] | Freq: Two times a day (BID) | OPHTHALMIC | Status: DC
  Administered 2019-11-09 – 2019-11-24 (×31): 1 [drp] via OPHTHALMIC
  Filled 2019-11-09: qty 5

## 2019-11-09 MED ORDER — POLYETHYLENE GLYCOL 3350 17 G PO PACK
17.0000 g | PACK | Freq: Every day | ORAL | Status: DC
  Administered 2019-11-09 – 2019-11-21 (×12): 17 g via ORAL
  Filled 2019-11-09 (×14): qty 1

## 2019-11-09 NOTE — Progress Notes (Signed)
[place] at [facility]   PATIENT NAME: Tony Flores    MR#:  [id number]  DATE OF BIRTH:  04-06-1930  SUBJECTIVE:  CHIEF COMPLAINT:   Chief Complaint  Patient presents with  . Altered Mental Status  Pleasantly confused. No new c/o  REVIEW OF SYSTEMS:  ROSunable to obtain due to mental status DRUG ALLERGIES:  No Known Allergies VITALS:  Blood pressure (!) 141/91, pulse 100, temperature 98.7 F (37.1 C), temperature source Oral, resp. rate 18, height 5\' 10"  (1.778 m), weight 70.3 kg, SpO2 93 %. PHYSICAL EXAMINATION:  Physical Exam HENT:     Head: Normocephalic and atraumatic.  Eyes:     Conjunctiva/sclera: Conjunctivae normal.     Pupils: Pupils are equal, round, and reactive to light.  Neck:     Musculoskeletal: Normal range of motion and neck supple.     Thyroid: No thyromegaly.     Trachea: No tracheal deviation.  Cardiovascular:     Rate and Rhythm: Normal rate and regular rhythm.     Heart sounds: Normal heart sounds.  Pulmonary:     Effort: Pulmonary effort is normal. No respiratory distress.     Breath sounds: Normal breath sounds. No wheezing.  Chest:     Chest wall: No tenderness.  Abdominal:     General: Bowel sounds are normal. There is no distension.     Palpations: Abdomen is soft.     Tenderness: There is no abdominal tenderness.  Musculoskeletal: Normal range of motion.  Skin:    General: Skin is warm and dry.     Findings: No rash.  Neurological:     Mental Status: He is alert. He is confused.     Cranial Nerves: No cranial nerve deficit.    LABORATORY PANEL:  Male CBC Recent Labs  Lab 11/09/19 0504  WBC 6.1  HGB 12.1*  HCT 35.9*  PLT 158   ------------------------------------------------------------------------------------------------------------------ Chemistries  Recent Labs  Lab 11/06/19 1706 11/06/19 1921  11/09/19 0504  NA 138  --    < > 140  K 3.1*  --    < > 3.6  CL 95*  --    < > 105  CO2 28  --    < > 25   GLUCOSE 188*  --    < > 151*  BUN 24*  --    < > 21  CREATININE 0.86  --    < > 0.59*  CALCIUM 8.8*  --    < > 8.3*  MG  --  1.8  --   --   AST 36  --   --   --   ALT 22  --   --   --   ALKPHOS 89  --   --   --   BILITOT 1.8*  --   --   --    < > = values in this interval not displayed.   RADIOLOGY:  No results found. ASSESSMENT AND PLAN:  Tony Flores is a 85 y.o. male with medical history significant for type 2 diabetes, hypertension, COPD, and tobacco use who is admitted with acute encephalopathy with reported falls at his independent living facility.  Acute encephalopathy: - likely multifactorial - metabolic with underlying cognitive impairment.  No obvious infectious cause.  He is dehydrated on examination.  CT head is without acute findings, does show chronic ischemic microvascular white matter disease. -Continue IV fluid resuscitation overnight -normal TSH, low B12, UDS neg  * L1-L2  disc space fracture s/p falls Reported multiple falls over the last week without obvious significant injury.  -PT/OT recommends STR/SNF -Fall precautions -CT lumbar spine shows L1-L2 disc space fracture - appreciate Neurosurgery input, poor operative candidate - TLSO in place for now, I was able to talk with step son (decision maker) Scherrie Bateman at [phone] and he is leaning towards surgery although he would like to discuss with neurosurgery. I've sent message to Dr Lacinda Axon to call him - so far no luck per Dr Lacinda Axon - if they can agree - he's planning for surgery coming Monday - per neurosurgery - no ambulation as unstable fracture, can raise HOB to 45 degress Per psych patient doesn't have decision making capacity - pain mgmt as need  Hypokalemia: Repleted & resolved  COPD: Chronic and stable without active wheezing or hypoxia.  Not requiring maintenance therapy as an outpatient.  Continue monitor.  Hypertension: Currently normotensive.  Holding HCTZ as above, hold atenolol for now.   Type 2 diabetes: On Metformin and Amaryl as an outpatient.  sensitive SSI while in hospital.  Tobacco use: Reports ongoing tobacco use and smoking 1 pack/day     All the records are reviewed and case discussed with Care Management/Social Worker. Management plans discussed with the patient, nursing and they are in agreement.  CODE STATUS: Full Code  TOTAL TIME TAKING CARE OF THIS PATIENT: 35 minutes.   More than 50% of the time was spent in counseling/coordination of care: YES  POSSIBLE D/C IN 4-5 DAYS, DEPENDING ON CLINICAL CONDITION.   Max Sane M.D on 11/09/2019 at 5:47 PM  Between 7am to 6pm - Pager - [phone]  After 6pm go to [email or web address] - password [email or web address]  [facility]   CC: Primary care physician; Patient, No Pcp Per  Note: This dictation was prepared with Dragon dictation along with smaller phrase technology. Any transcriptional errors that result from this process are unintentional.

## 2019-11-09 NOTE — Consult Note (Signed)
Neurosurgery-New Consultation Evaluation 11/09/2019 Tony Flores [id number]  Identifying Statement: Tony Flores is a 85 y.o. male from [place] [id number] with lumbar fracture  Physician Requesting Consultation: Max Sane, MD  Interval history 11/09/2019 Patient states back pain has improved.  Denies any lower extremity complaints at this time including pain or numbness.  He has received brace.  History of Present Illness from Dr. Jonathon Jordan note. Tony Flores was brought to the ED on 11/15 after being found down and with AMS> He was complaining of back and buttocks pain and a CT of the lumbar spine was obtained that showed a fracture through the L1/2 disc space and ankylosed spine adjacent to it. He has a PMH for DM and COPD and still not returned to baseline mental status. He denies any obvious leg pain. He does not endorse any numbness. He is not coherent enough to tell me of any events surrounding the fall. He states he has no close family members in the state and no one to help with medical decisions.   Past Medical History:  Past Medical History:  Diagnosis Date  . COPD (chronic obstructive pulmonary disease) ([place])   . Diabetes mellitus without complication ([place])   . Hypertension   . Tobacco use     Social History: Social History   Socioeconomic History  . Marital status: Widowed    Spouse name: Not on file  . Number of children: Not on file  . Years of education: Not on file  . Highest education level: Not on file  Occupational History  . Not on file  Social Needs  . Financial resource strain: Not on file  . Food insecurity    Worry: Not on file    Inability: Not on file  . Transportation needs    Medical: Not on file    Non-medical: Not on file  Tobacco Use  . Smoking status: Current Every Day Smoker    Packs/day: 1.00  Substance and Sexual Activity  . Alcohol use: Not Currently  . Drug use: Never  . Sexual activity: Not on file  Lifestyle  . Physical  activity    Days per week: Not on file    Minutes per session: Not on file  . Stress: Not on file  Relationships  . Social Herbalist on phone: Not on file    Gets together: Not on file    Attends religious service: Not on file    Active member of club or organization: Not on file    Attends meetings of clubs or organizations: Not on file    Relationship status: Not on file  . Intimate partner violence    Fear of current or ex partner: Not on file    Emotionally abused: Not on file    Physically abused: Not on file    Forced sexual activity: Not on file  Other Topics Concern  . Not on file  Social History Narrative  . Not on file    Family History: Family History  Problem Relation Age of Onset  . Diabetes Mother   . Heart attack Mother   . Stroke Mother   . Diabetes Father   . Heart attack Father    Physical Exam: BP 132/82 (BP Location: Left Arm)   Pulse 90   Temp 97.6 F (36.4 C) (Oral)   Resp 18   Ht 5\' 10"  (1.778 m)   Wt 70.3 kg   SpO2 91%   BMI 22.24  kg/m  Body mass index is 22.24 kg/m. Body surface area is 1.86 meters squared. General appearance: Alert, not able to fully cooperate with exam Head: Normocephalic, atraumatic Back: Brace present  Neurologic exam:  Mental status: alertness: alert Speech: fluent and clear Cranial nerves:  V/VII:no evidence of facial droop or weakness  VIII: hard of hearing Motor:strength 5/5 in hip flexion, dorsiflexion, and plantarflexion.  5/5 quad bilaterally  sensory: intact to light touch in lower extremities Gait: not tested  Laboratory: Results for orders placed or performed during the hospital encounter of 11/06/19  SARS CORONAVIRUS 2 (TAT 6-24 HRS) Nasopharyngeal Nasopharyngeal Swab   Specimen: Nasopharyngeal Swab  Result Value Ref Range   SARS Coronavirus 2 NEGATIVE NEGATIVE  Urine culture   Specimen: Urine, Random  Result Value Ref Range   Specimen Description      URINE, RANDOM Performed at  [facility], [address] [place], [place] [address]    Special Requests      NONE Performed at [facility], [address] [place], [place] [address]    Culture MULTIPLE SPECIES PRESENT, SUGGEST RECOLLECTION (A)    Report Status 11/09/2019 FINAL   CBC with Differential  Result Value Ref Range   WBC 10.5 4.0 - 10.5 K/uL   RBC 4.78 4.22 - 5.81 MIL/uL   Hemoglobin 14.2 13.0 - 17.0 g/dL   HCT 82.4 23.5 - 36.1 %   MCV 88.3 80.0 - 100.0 fL   MCH 29.7 26.0 - 34.0 pg   MCHC 33.6 30.0 - 36.0 g/dL   RDW 44.3 15.4 - 00.8 %   Platelets 148 (L) 150 - 400 K/uL   nRBC 0.0 0.0 - 0.2 %   Neutrophils Relative % 90 %   Neutro Abs 9.5 (H) 1.7 - 7.7 K/uL   Lymphocytes Relative 2 %   Lymphs Abs 0.2 (L) 0.7 - 4.0 K/uL   Monocytes Relative 7 %   Monocytes Absolute 0.7 0.1 - 1.0 K/uL   Eosinophils Relative 0 %   Eosinophils Absolute 0.0 0.0 - 0.5 K/uL   Basophils Relative 0 %   Basophils Absolute 0.0 0.0 - 0.1 K/uL   Immature Granulocytes 1 %   Abs Immature Granulocytes 0.06 0.00 - 0.07 K/uL  Comprehensive metabolic panel  Result Value Ref Range   Sodium 138 135 - 145 mmol/L   Potassium 3.1 (L) 3.5 - 5.1 mmol/L   Chloride 95 (L) 98 - 111 mmol/L   CO2 28 22 - 32 mmol/L   Glucose, Bld 188 (H) 70 - 99 mg/dL   BUN 24 (H) 8 - 23 mg/dL   Creatinine, Ser 6.76 0.61 - 1.24 mg/dL   Calcium 8.8 (L) 8.9 - 10.3 mg/dL   Total Protein 6.8 6.5 - 8.1 g/dL   Albumin 3.4 (L) 3.5 - 5.0 g/dL   AST 36 15 - 41 U/L   ALT 22 0 - 44 U/L   Alkaline Phosphatase 89 38 - 126 U/L   Total Bilirubin 1.8 (H) 0.3 - 1.2 mg/dL   GFR calc non Af Amer >60 >60 mL/min   GFR calc Af Amer >60 >60 mL/min   Anion gap 15 5 - 15  Lactic acid, plasma  Result Value Ref Range   Lactic Acid, Venous 2.3 (HH) 0.5 - 1.9 mmol/L  Lactic acid, plasma  Result Value Ref Range   Lactic Acid, Venous 1.5 0.5 - 1.9 mmol/L  Urinalysis, Complete w Microscopic  Result Value Ref Range   Color, Urine YELLOW (A)  YELLOW    APPearance HAZY (A) CLEAR   Specific Gravity, Urine 1.023 1.005 - 1.030   pH 5.0 5.0 - 8.0   Glucose, UA NEGATIVE NEGATIVE mg/dL   Hgb urine dipstick MODERATE (A) NEGATIVE   Bilirubin Urine NEGATIVE NEGATIVE   Ketones, ur 20 (A) NEGATIVE mg/dL   Protein, ur 30 (A) NEGATIVE mg/dL   Nitrite NEGATIVE NEGATIVE   Leukocytes,Ua NEGATIVE NEGATIVE   RBC / HPF 0-5 0 - 5 RBC/hpf   WBC, UA 0-5 0 - 5 WBC/hpf   Bacteria, UA RARE (A) NONE SEEN   Squamous Epithelial / LPF 0-5 0 - 5   Mucus PRESENT    Hyaline Casts, UA PRESENT   Magnesium  Result Value Ref Range   Magnesium 1.8 1.7 - 2.4 mg/dL  Phosphorus  Result Value Ref Range   Phosphorus 3.2 2.5 - 4.6 mg/dL  TSH  Result Value Ref Range   TSH 0.746 0.350 - 4.500 uIU/mL  CBC  Result Value Ref Range   WBC 9.0 4.0 - 10.5 K/uL   RBC 4.44 4.22 - 5.81 MIL/uL   Hemoglobin 13.1 13.0 - 17.0 g/dL   HCT 54.0 (L) 98.1 - 19.1 %   MCV 86.5 80.0 - 100.0 fL   MCH 29.5 26.0 - 34.0 pg   MCHC 34.1 30.0 - 36.0 g/dL   RDW 47.8 29.5 - 62.1 %   Platelets 145 (L) 150 - 400 K/uL   nRBC 0.0 0.0 - 0.2 %  Basic metabolic panel  Result Value Ref Range   Sodium 142 135 - 145 mmol/L   Potassium 3.5 3.5 - 5.1 mmol/L   Chloride 103 98 - 111 mmol/L   CO2 26 22 - 32 mmol/L   Glucose, Bld 165 (H) 70 - 99 mg/dL   BUN 25 (H) 8 - 23 mg/dL   Creatinine, Ser 3.08 0.61 - 1.24 mg/dL   Calcium 8.3 (L) 8.9 - 10.3 mg/dL   GFR calc non Af Amer >60 >60 mL/min   GFR calc Af Amer >60 >60 mL/min   Anion gap 13 5 - 15  Hemoglobin A1c  Result Value Ref Range   Hgb A1c MFr Bld 5.7 (H) 4.8 - 5.6 %   Mean Plasma Glucose 116.89 mg/dL  Vitamin M57  Result Value Ref Range   Vitamin B-12 120 (L) 180 - 914 pg/mL  Urine Drug Screen, Qualitative ([facility] only)  Result Value Ref Range   Tricyclic, Ur Screen NONE DETECTED NONE DETECTED   Amphetamines, Ur Screen NONE DETECTED NONE DETECTED   MDMA (Ecstasy)Ur Screen NONE DETECTED NONE DETECTED   Cocaine Metabolite,Ur [place] NONE DETECTED  NONE DETECTED   Opiate, Ur Screen NONE DETECTED NONE DETECTED   Phencyclidine (PCP) Ur S NONE DETECTED NONE DETECTED   Cannabinoid 50 Ng, Ur [place] NONE DETECTED NONE DETECTED   Barbiturates, Ur Screen NONE DETECTED NONE DETECTED   Benzodiazepine, Ur Scrn NONE DETECTED NONE DETECTED   Methadone Scn, Ur NONE DETECTED NONE DETECTED  Glucose, capillary  Result Value Ref Range   Glucose-Capillary 144 (H) 70 - 99 mg/dL   Comment 1 Notify RN   Glucose, capillary  Result Value Ref Range   Glucose-Capillary 155 (H) 70 - 99 mg/dL   Comment 1 Notify RN   Glucose, capillary  Result Value Ref Range   Glucose-Capillary 102 (H) 70 - 99 mg/dL   Comment 1 Notify RN   AM Labs cbc  Result Value Ref Range   WBC 5.7 4.0 - 10.5 K/uL  RBC 4.07 (L) 4.22 - 5.81 MIL/uL   Hemoglobin 12.0 (L) 13.0 - 17.0 g/dL   HCT 69.637.2 (L) 29.539.0 - 28.452.0 %   MCV 91.4 80.0 - 100.0 fL   MCH 29.5 26.0 - 34.0 pg   MCHC 32.3 30.0 - 36.0 g/dL   RDW 13.213.4 44.011.5 - 10.215.5 %   Platelets 143 (L) 150 - 400 K/uL   nRBC 0.0 0.0 - 0.2 %  AM Labs bmp  Result Value Ref Range   Sodium 143 135 - 145 mmol/L   Potassium 3.7 3.5 - 5.1 mmol/L   Chloride 106 98 - 111 mmol/L   CO2 27 22 - 32 mmol/L   Glucose, Bld 96 70 - 99 mg/dL   BUN 21 8 - 23 mg/dL   Creatinine, Ser 7.250.68 0.61 - 1.24 mg/dL   Calcium 8.2 (L) 8.9 - 10.3 mg/dL   GFR calc non Af Amer >60 >60 mL/min   GFR calc Af Amer >60 >60 mL/min   Anion gap 10 5 - 15  Glucose, capillary  Result Value Ref Range   Glucose-Capillary 80 70 - 99 mg/dL   Comment 1 Notify RN   Urinalysis, Routine w reflex microscopic  Result Value Ref Range   Color, Urine YELLOW (A) YELLOW   APPearance CLEAR (A) CLEAR   Specific Gravity, Urine 1.018 1.005 - 1.030   pH 5.0 5.0 - 8.0   Glucose, UA NEGATIVE NEGATIVE mg/dL   Hgb urine dipstick NEGATIVE NEGATIVE   Bilirubin Urine NEGATIVE NEGATIVE   Ketones, ur 5 (A) NEGATIVE mg/dL   Protein, ur NEGATIVE NEGATIVE mg/dL   Nitrite NEGATIVE NEGATIVE    Leukocytes,Ua NEGATIVE NEGATIVE  Glucose, capillary  Result Value Ref Range   Glucose-Capillary 92 70 - 99 mg/dL   Comment 1 Notify RN   Glucose, capillary  Result Value Ref Range   Glucose-Capillary 128 (H) 70 - 99 mg/dL   Comment 1 Notify RN   AM Labs cbc  Result Value Ref Range   WBC 6.1 4.0 - 10.5 K/uL   RBC 4.10 (L) 4.22 - 5.81 MIL/uL   Hemoglobin 12.1 (L) 13.0 - 17.0 g/dL   HCT 36.635.9 (L) 44.039.0 - 34.752.0 %   MCV 87.6 80.0 - 100.0 fL   MCH 29.5 26.0 - 34.0 pg   MCHC 33.7 30.0 - 36.0 g/dL   RDW 42.513.2 95.611.5 - 38.715.5 %   Platelets 158 150 - 400 K/uL   nRBC 0.0 0.0 - 0.2 %  AM Labs bmp  Result Value Ref Range   Sodium 140 135 - 145 mmol/L   Potassium 3.6 3.5 - 5.1 mmol/L   Chloride 105 98 - 111 mmol/L   CO2 25 22 - 32 mmol/L   Glucose, Bld 151 (H) 70 - 99 mg/dL   BUN 21 8 - 23 mg/dL   Creatinine, Ser 5.640.59 (L) 0.61 - 1.24 mg/dL   Calcium 8.3 (L) 8.9 - 10.3 mg/dL   GFR calc non Af Amer >60 >60 mL/min   GFR calc Af Amer >60 >60 mL/min   Anion gap 10 5 - 15  Glucose, capillary  Result Value Ref Range   Glucose-Capillary 94 70 - 99 mg/dL   Comment 1 Notify RN   Glucose, capillary  Result Value Ref Range   Glucose-Capillary 155 (H) 70 - 99 mg/dL  Glucose, capillary  Result Value Ref Range   Glucose-Capillary 133 (H) 70 - 99 mg/dL  Troponin I (High Sensitivity)  Result Value Ref Range   Troponin  I (High Sensitivity) 11 <18 ng/L  Troponin I (High Sensitivity)  Result Value Ref Range   Troponin I (High Sensitivity) 11 <18 ng/L   Imaging: CT Lumbar Spine:  1. Osteopenia with lower thoracic through L3 ankylosis, and a degree of bilateral SI joint ankylosis. 2. Horizontal fracture through the L1-L2 disc space fracture now with mild distraction of the anterior disc space / mild kyphosis. But no spondylolisthesis, and the posterior elements at each level  appear to remain intact and normally aligned. 3. Associated small volume hematoma in the paraspinal soft tissues tracking inferiorly  along the right psoas muscle. 4. No other acute osseous abnormality identified. 5. Degenerative multifactorial spinal and left neural foraminal stenosis at L4-L5. 6. Aortic Atherosclerosis (ICD10-I70.0). Large bowel diverticulosis.   Impression/Plan:   Per Dr. Adriana Simas, continue to hold off on PT. Brace should be worn at all time. Awaiting plan on surgical intervention.    Dr. Adriana Simas A&P from 11/07/2019 Mr. Hupp is here with a likely fall and AMS resulting in a fracture at L1/2 that does not appear stable. He has some ALL disruption but no listhesis and neurologic exam is reassuring. I tried to discuss with him the possibility of surgery and he states he would want to avoid if possible but conservative therapy has a high rate of failure given his bone quality and adjacent ankylosed spine. However, surgery would likely consist of a T10- L3 fusion which does carry some risks. For now, I recommend a TLSO brace and spine precautions. We will likely need either family or pscyhiatry help as I am not sure he has decision making capacity.    He is OK for a diet and DVT prophylaxis at this time.   1.  Diagnosis: L1/2 fracture  - TLSO brace and spine precautions at this time. Possible surgery pending further discussions of capacity  2.  Plan

## 2019-11-09 NOTE — Consult Note (Deleted)
PHARMACY -  BRIEF ANTIBIOTIC NOTE   Pharmacy has received consult(s) for cellultis from an ED provider.  The patient's profile has been reviewed for ht/wt/allergies/indication/available labs.    One time order(s) placed for pip/tazo and vancomycin   Further antibiotics/pharmacy consults should be ordered by admitting physician if indicated.                       Thank you, Oswald Hillock 11/09/2019  9:14 AM

## 2019-11-09 NOTE — Consult Note (Signed)
[facility] Consult   Reason for Consult: Capacity evaluation Referring Physician: Dr. Manuella Ghazi Patient Identification: Tony Flores MRN:  [id number] Principal Diagnosis: Acute metabolic encephalopathy Diagnosis:  Principal Problem:   Acute metabolic encephalopathy Active Problems:   Type 2 diabetes mellitus ([place])   Hypertension associated with diabetes ([place])   COPD (chronic obstructive pulmonary disease) (HCC)   Hypokalemia   Closed fracture dislocation of lumbar spine (HCC)   Confusion   Dehydration   Acute low back pain without sciatica   Total Time spent with patient: 30 minutes  Subjective:   Tony Flores is a 85 y.o. male patient admitted with lumbar fracture.  HPI: 85 year old male admitted on 11/15 after being found with altered mental status complaining of back pain.  During his hospital course, CT demonstrated fracture of the lumbar spine and medical team is now considering surgical management.  Psychiatry consulted for capacity evaluation.  Upon evaluation, patient is calm and cooperative.  Patient is able to state his name and birthday however unable to state place or the date.  Patient is also unable to state why he is in the hospital and what current medical problems he has.  When prompted that he has back pain and possibly will require back surgery, he seems amenable to having surgical management, however does not seem to fully understand risks and benefits of the procedure and proper aftercare.  He also is unable to express a choice stating he would have the procedure if the medical team felt that he needed it.  He then however contradictory to his previous statement and stated that he would not want a wear back brace following the surgery.  It is unclear that patient understands the severity of the procedure and of the severity of the aftercare necessary.   He is unable to provide satisfactory reasoning behind his decision making process.  While he does  understand the potential risk of death from the surgery, patient is unable to comprehend the significance of the complications he may encounter and what that would mean for his daily life.  Past Psychiatric History: Patient has never seen a psychiatrist or had psychiatric hospitalizations.  Patient denies previous suicide attempts, SI, HI.  Risk to Self:   No Risk to Others:  No Prior Inpatient Therapy:  No Prior Outpatient Therapy:  No  Past Medical History:  Past Medical History:  Diagnosis Date  . COPD (chronic obstructive pulmonary disease) ([place])   . Diabetes mellitus without complication ([place])   . Hypertension   . Tobacco use    History reviewed. No pertinent surgical history. Family History:  Family History  Problem Relation Age of Onset  . Diabetes Mother   . Heart attack Mother   . Stroke Mother   . Diabetes Father   . Heart attack Father    Family Psychiatric  History: Denies Social History:  Social History   Substance and Sexual Activity  Alcohol Use Not Currently     Social History   Substance and Sexual Activity  Drug Use Never    Social History   Socioeconomic History  . Marital status: Widowed    Spouse name: Not on file  . Number of children: Not on file  . Years of education: Not on file  . Highest education level: Not on file  Occupational History  . Not on file  Social Needs  . Financial resource strain: Not on file  . Food insecurity    Worry: Not on file  Inability: Not on file  . Transportation needs    Medical: Not on file    Non-medical: Not on file  Tobacco Use  . Smoking status: Current Every Day Smoker    Packs/day: 1.00  Substance and Sexual Activity  . Alcohol use: Not Currently  . Drug use: Never  . Sexual activity: Not on file  Lifestyle  . Physical activity    Days per week: Not on file    Minutes per session: Not on file  . Stress: Not on file  Relationships  . Social Musicianconnections    Talks on phone: Not on file     Gets together: Not on file    Attends religious service: Not on file    Active member of club or organization: Not on file    Attends meetings of clubs or organizations: Not on file    Relationship status: Not on file  Other Topics Concern  . Not on file  Social History Narrative  . Not on file   Additional Social History: Denies substance abuse    Allergies:  No Known Allergies  Labs:  Results for orders placed or performed during the hospital encounter of 11/06/19 (from the past 48 hour(s))  Glucose, capillary     Status: Abnormal   Collection Time: 11/07/19 12:30 PM  Result Value Ref Range   Glucose-Capillary 155 (H) 70 - 99 mg/dL   Comment 1 Notify RN   Glucose, capillary     Status: Abnormal   Collection Time: 11/07/19  4:29 PM  Result Value Ref Range   Glucose-Capillary 102 (H) 70 - 99 mg/dL   Comment 1 Notify RN   Glucose, capillary     Status: None   Collection Time: 11/08/19 12:49 AM  Result Value Ref Range   Glucose-Capillary 80 70 - 99 mg/dL   Comment 1 Notify RN   AM Labs cbc     Status: Abnormal   Collection Time: 11/08/19  5:19 AM  Result Value Ref Range   WBC 5.7 4.0 - 10.5 K/uL   RBC 4.07 (L) 4.22 - 5.81 MIL/uL   Hemoglobin 12.0 (L) 13.0 - 17.0 g/dL   HCT 81.137.2 (L) 91.439.0 - 78.252.0 %   MCV 91.4 80.0 - 100.0 fL   MCH 29.5 26.0 - 34.0 pg   MCHC 32.3 30.0 - 36.0 g/dL   RDW 95.613.4 21.311.5 - 08.615.5 %   Platelets 143 (L) 150 - 400 K/uL   nRBC 0.0 0.0 - 0.2 %    Comment: Performed at [facility], [address] [place], [place] [address]  AM Labs bmp     Status: Abnormal   Collection Time: 11/08/19  5:19 AM  Result Value Ref Range   Sodium 143 135 - 145 mmol/L   Potassium 3.7 3.5 - 5.1 mmol/L   Chloride 106 98 - 111 mmol/L   CO2 27 22 - 32 mmol/L   Glucose, Bld 96 70 - 99 mg/dL   BUN 21 8 - 23 mg/dL   Creatinine, Ser 9.620.68 0.61 - 1.24 mg/dL   Calcium 8.2 (L) 8.9 - 10.3 mg/dL   GFR calc non Af Amer >60 >60 mL/min   GFR calc Af Amer >60 >60 mL/min    Anion gap 10 5 - 15    Comment: Performed at [facility], [address] [place], [place] [address]  Urinalysis, Routine w reflex microscopic     Status: Abnormal   Collection Time: 11/08/19  7:44 AM  Result  Value Ref Range   Color, Urine YELLOW (A) YELLOW   APPearance CLEAR (A) CLEAR   Specific Gravity, Urine 1.018 1.005 - 1.030   pH 5.0 5.0 - 8.0   Glucose, UA NEGATIVE NEGATIVE mg/dL   Hgb urine dipstick NEGATIVE NEGATIVE   Bilirubin Urine NEGATIVE NEGATIVE   Ketones, ur 5 (A) NEGATIVE mg/dL   Protein, ur NEGATIVE NEGATIVE mg/dL   Nitrite NEGATIVE NEGATIVE   Leukocytes,Ua NEGATIVE NEGATIVE    Comment: Performed at [facility], [address] [place], [place] [address]  Urine culture     Status: Abnormal   Collection Time: 11/08/19  7:44 AM   Specimen: Urine, Random  Result Value Ref Range   Specimen Description      URINE, RANDOM Performed at [facility], [address] [place], [place] [address]    Special Requests      NONE Performed at [facility], [address] [place], [place] [address]    Culture MULTIPLE SPECIES PRESENT, SUGGEST RECOLLECTION (A)    Report Status 11/09/2019 FINAL   Glucose, capillary     Status: None   Collection Time: 11/08/19  9:10 AM  Result Value Ref Range   Glucose-Capillary 92 70 - 99 mg/dL   Comment 1 Notify RN   Glucose, capillary     Status: Abnormal   Collection Time: 11/08/19 11:31 AM  Result Value Ref Range   Glucose-Capillary 128 (H) 70 - 99 mg/dL   Comment 1 Notify RN   Glucose, capillary     Status: None   Collection Time: 11/08/19  5:20 PM  Result Value Ref Range   Glucose-Capillary 94 70 - 99 mg/dL   Comment 1 Notify RN   Glucose, capillary     Status: Abnormal   Collection Time: 11/08/19 10:08 PM  Result Value Ref Range   Glucose-Capillary 155 (H) 70 - 99 mg/dL  AM Labs cbc     Status: Abnormal   Collection Time: 11/09/19  5:04 AM  Result Value Ref Range   WBC 6.1 4.0 -  10.5 K/uL   RBC 4.10 (L) 4.22 - 5.81 MIL/uL   Hemoglobin 12.1 (L) 13.0 - 17.0 g/dL   HCT 91.4 (L) 78.2 - 95.6 %   MCV 87.6 80.0 - 100.0 fL   MCH 29.5 26.0 - 34.0 pg   MCHC 33.7 30.0 - 36.0 g/dL   RDW 21.3 08.6 - 57.8 %   Platelets 158 150 - 400 K/uL   nRBC 0.0 0.0 - 0.2 %    Comment: Performed at [facility], [address] [place], [place] [address]  AM Labs bmp     Status: Abnormal   Collection Time: 11/09/19  5:04 AM  Result Value Ref Range   Sodium 140 135 - 145 mmol/L   Potassium 3.6 3.5 - 5.1 mmol/L   Chloride 105 98 - 111 mmol/L   CO2 25 22 - 32 mmol/L   Glucose, Bld 151 (H) 70 - 99 mg/dL   BUN 21 8 - 23 mg/dL   Creatinine, Ser 9.52 (L) 0.61 - 1.24 mg/dL   Calcium 8.3 (L) 8.9 - 10.3 mg/dL   GFR calc non Af Amer >60 >60 mL/min   GFR calc Af Amer >60 >60 mL/min   Anion gap 10 5 - 15    Comment: Performed at [facility], [address] [place], [place] [address]  Glucose, capillary     Status: Abnormal   Collection Time: 11/09/19  7:45 AM  Result Value Ref Range   Glucose-Capillary 133 (H) 70 - 99 mg/dL  Type and screen     Status: None   Collection Time: 11/09/19  8:04 AM  Result Value Ref Range   ABO/RH(D) O NEG    Antibody Screen NEG    Sample Expiration      11/12/2019,2359 Performed at [facility], [address], [place], [place] [address]     Current Facility-Administered Medications  Medication Dose Route Frequency Provider Last Rate Last Dose  . acetaminophen (TYLENOL) tablet 650 mg  650 mg Oral Q6H PRN Charlsie Quest, MD       Or  . acetaminophen (TYLENOL) suppository 650 mg  650 mg Rectal Q6H PRN Charlsie Quest, MD      . Melene Muller ON 11/14/2019] ceFAZolin (ANCEF) IVPB 2g/100 mL premix  2 g Intravenous Once Lucy Chris, MD      . enoxaparin (LOVENOX) injection 40 mg  40 mg Subcutaneous Q24H Charlsie Quest, MD   40 mg at 11/09/19 0920  . HYDROcodone-acetaminophen (NORCO/VICODIN) 5-325 MG per tablet 1 tablet  1 tablet  Oral Q4H PRN Delfino Lovett, MD   1 tablet at 11/09/19 0920  . insulin aspart (novoLOG) injection 0-9 Units  0-9 Units Subcutaneous TID WC Charlsie Quest, MD   1 Units at 11/09/19 [phone]  . morphine 2 MG/ML injection 1 mg  1 mg Intravenous Q4H PRN Delfino Lovett, MD   1 mg at 11/08/19 1849  . polyethylene glycol (MIRALAX / GLYCOLAX) packet 17 g  17 g Oral Daily Delfino Lovett, MD   17 g at 11/09/19 0919    Musculoskeletal: Strength & Muscle Tone: decreased Gait & Station: Did not assess.  PT following patient. Patient leans: N/A  Psychiatric Specialty Exam: Physical Exam  ROS  Blood pressure 132/82, pulse 90, temperature 97.6 F (36.4 C), temperature source Oral, resp. rate 18, height 5\' 10"  (1.778 m), weight 70.3 kg, SpO2 91 %.Body mass index is 22.24 kg/m.  General Appearance: Disheveled  Eye Contact:  Good  Speech:  Normal Rate  Volume:  Normal  Mood:  Euthymic  Affect:  Appropriate  Thought Process:  Coherent  Orientation:  Other:  Patient oriented to person however not to place or time.  Thought Content:  Abstract Reasoning  Suicidal Thoughts:  No  Homicidal Thoughts:  No  Memory:  Recent;   Poor  Judgement:  Poor  Insight:  Shallow  Psychomotor Activity:  Normal  Concentration:  Concentration: Fair  Recall:  Poor  Fund of Knowledge:  Fair  Language:  Fair  Akathisia:  No  Handed:  Right  AIMS (if indicated):     Assets:  Communication Skills Desire for Improvement Leisure Time Resilience  ADL's:  Impaired  Cognition:  Impaired,  Moderate  Sleep:        Treatment Plan Summary: 85 year old male admitted for a lumbar fracture.  Patient unable to vocalize adequate understanding of his condition.  Unable to express a choice stating simply he will have surgical management if the medical team recommends it.  Patient does not seem to appreciate risks and benefits of the procedure in a meaningful way.  Patient does not have capacity to make medical decision regarding surgical  management of his lumbar fracture at this time.  Please consult with family to arrange healthcare power of attorney.  Disposition: No evidence of imminent risk to self or others at present.    89, MD 11/09/2019 11:47 AM

## 2019-11-09 NOTE — Progress Notes (Signed)
PT Cancellation Note  Patient Details Name: Tony Flores MRN: [id number] DOB: [birth year]   Cancelled Treatment:    Reason Eval/Treat Not Completed: Other (comment). Per char review, pt awaiting surgical intervention and to hold PT at this time. Will follow acutely until definitive plans established.   Zaiya Annunziato 11/09/2019, 10:51 AM  Greggory Stallion, PT, DPT [phone]

## 2019-11-10 ENCOUNTER — Other Ambulatory Visit: Payer: Medicare HMO

## 2019-11-10 DIAGNOSIS — Z7189 Other specified counseling: Secondary | ICD-10-CM

## 2019-11-10 DIAGNOSIS — Z515 Encounter for palliative care: Secondary | ICD-10-CM

## 2019-11-10 LAB — GLUCOSE, CAPILLARY
Glucose-Capillary: 106 mg/dL — ABNORMAL HIGH (ref 70–99)
Glucose-Capillary: 147 mg/dL — ABNORMAL HIGH (ref 70–99)
Glucose-Capillary: 105 mg/dL — ABNORMAL HIGH (ref 70–99)
Glucose-Capillary: 139 mg/dL — ABNORMAL HIGH (ref 70–99)

## 2019-11-10 MED ORDER — PNEUMOCOCCAL VAC POLYVALENT 25 MCG/0.5ML IJ INJ
0.5000 mL | INJECTION | INTRAMUSCULAR | Status: AC
  Administered 2019-11-16: 06:00:00 0.5 mL via INTRAMUSCULAR
  Filled 2019-11-10: qty 0.5

## 2019-11-10 MED ORDER — INFLUENZA VAC A&B SA ADJ QUAD 0.5 ML IM PRSY
0.5000 mL | PREFILLED_SYRINGE | INTRAMUSCULAR | Status: DC
  Filled 2019-11-10: qty 0.5

## 2019-11-10 NOTE — Progress Notes (Signed)
[facility] Progress Note  Patient: Tony Flores ZOX:[id number]   PCP: Patient, No Pcp Per DOB: 03/25/1930   DOA: 11/06/2019   DOS: 11/10/2019   Date of Service: the patient was seen and examined on 11/10/2019  Chief Complaint  Patient presents with  . Altered Mental Status   Brief hospital course: Pt. with PMH of type 2 diabetes, hypertension, COPD, and tobacco use ; presented with complain of confusion, was found to have progressive dementia as well as subacute L1-L2 disc space fracture.  Currently further plan is continue IV hydration, patient will require surgery for his L-spine displaced fracture currently does not have medical decision-making capacity and does not have healthcare power of attorney or proxy and social worker is working on the case to arrange 1.  Subjective: Mild back pain.  No nausea no vomiting no fever no chills.  No diarrhea no constipation.  Assessment and Plan: Scheduled Meds: . enoxaparin (LOVENOX) injection  40 mg Subcutaneous Q24H  . insulin aspart  0-9 Units Subcutaneous TID WC  . ketotifen  1 drop Left Eye BID  . polyethylene glycol  17 g Oral Daily   Continuous Infusions: . [START ON 11/14/2019]  ceFAZolin (ANCEF) IV     PRN Meds: acetaminophen **OR** acetaminophen, HYDROcodone-acetaminophen, morphine injection Acute encephalopathy: - likely multifactorial although suspect this is just progression of his cognitive impairment. No obvious infectious cause. He was dehydrated on examination. And was given IV fluids. CT head is without acute findings, does show chronic ischemic microvascular white matter disease. normal TSH, low B12, UDS neg  * L1-L2 disc space fracture s/p falls Reported multiple falls over the last week without obvious significant injury. -PT/OT recommends STR/SNF -Fall precautions -CT lumbar spine shows L1-L2 disc space fracture - appreciate Neurosurgery input, poor operative candidate - TLSO in place for now, I was  able to talk with step son (decision maker) Aram BeechamWayne Harris at [phone] and he is leaning towards surgery although he would like to discuss with neurosurgery.  per neurosurgery - no ambulation as unstable fracture, can raise HOB to 45 degress Per psych patient doesn't have decision making capacity - pain mgmt as need -Concern is that the patient does not have any designated medical power of attorney or healthcare proxy. Doreene AdasStepson is per my discussion with the neurosurgery unable to make decisions regarding patient's goals of care or CODE STATUS but wants to pursue surgery if feasible. Given the nature and the extensiveness of the surgery currently neurosurgery feels that it is appropriate to establish a proper medical power of attorney for the patient. Social worker consulted to assist with the case.  APS report has been initially filed.  Hypokalemia: Repleted & resolved  COPD: Chronic and stable without active wheezing or hypoxia. Not requiring maintenance therapy as an outpatient. Continue monitor.  Hypertension: Currently normotensive. HoldingHCTZ as above, hold atenolol for now.  Type 2 diabetes: On Metformin and Amaryl as an outpatient.  sensitive SSI while in hospital.  Tobacco use: Reports ongoing tobacco use and smoking 1 pack/day  Diet: Carb modified diet  DVT Prophylaxis: Subcutaneous Lovenox   Advance goals of care discussion: Full code  Family Communication: no family was present at bedside, at the time of interview.  Unable to reach son on the phone.  Disposition:  Discharge to be determined .  Consultants: Neurosurgery, psychiatry Procedures: none  Antibiotics: Anti-infectives (From admission, onward)   Start     Dose/Rate Route Frequency Ordered Stop   11/14/19 0900  ceFAZolin (ANCEF)  IVPB 2g/100 mL premix     2 g 200 mL/hr over 30 Minutes Intravenous  Once 11/09/19 0912         Objective: Physical Exam: Vitals:   11/09/19 1946 11/10/19 0438  11/10/19 0825 11/10/19 1607  BP: (!) 143/104 (!) 162/87 139/86 (!) 164/88  Pulse: 82 89 86 90  Resp: 17 17 17 17   Temp: 98.3 F (36.8 C) 98.4 F (36.9 C) 98.2 F (36.8 C) 98.2 F (36.8 C)  TempSrc:   Oral Oral  SpO2: 97% 94% 94% 96%  Weight:      Height:        Intake/Output Summary (Last 24 hours) at 11/10/2019 1828 Last data filed at 11/10/2019 1407 Gross per 24 hour  Intake 0 ml  Output -  Net 0 ml   Filed Weights   11/06/19 1651 11/07/19 0530  Weight: 59 kg 70.3 kg   General: alert and oriented to place and person. Appear in mild distress, affect appropriate Eyes: PERRL, Conjunctiva normal ENT: Oral Mucosa Clear, moist  Neck: no JVD, no Abnormal Mass Or lumps Cardiovascular: S1 and S2 Present, no Murmur,  Respiratory: good respiratory effort, Bilateral Air entry equal and Decreased, no signs of accessory muscle use, Clear to Auscultation, no Crackles, no wheezes Abdomen: Bowel Sound present, Soft and no tenderness, no hernia Skin: no rashes  Extremities: no Pedal edema, no calf tenderness Neurologic: without any new focal findings Gait not checked due to patient safety concerns  Data Reviewed: I have personally reviewed and interpreted daily labs, tele strips, imagings as discussed above. I reviewed all nursing notes, pharmacy notes, vitals, pertinent old records I have discussed plan of care as described above with RN and patient/family.  CBC: Recent Labs  Lab 11/06/19 1706 11/07/19 0632 11/08/19 0519 11/09/19 0504  WBC 10.5 9.0 5.7 6.1  NEUTROABS 9.5*  --   --   --   HGB 14.2 13.1 12.0* 12.1*  HCT 42.2 38.4* 37.2* 35.9*  MCV 88.3 86.5 91.4 87.6  PLT 148* 145* 143* 539   Basic Metabolic Panel: Recent Labs  Lab 11/06/19 1706 11/06/19 1921 11/07/19 0632 11/08/19 0519 11/09/19 0504  NA 138  --  142 143 140  K 3.1*  --  3.5 3.7 3.6  CL 95*  --  103 106 105  CO2 28  --  26 27 25   GLUCOSE 188*  --  165* 96 151*  BUN 24*  --  25* 21 21  CREATININE  0.86  --  0.62 0.68 0.59*  CALCIUM 8.8*  --  8.3* 8.2* 8.3*  MG  --  1.8  --   --   --   PHOS  --  3.2  --   --   --     Liver Function Tests: Recent Labs  Lab 11/06/19 1706  AST 36  ALT 22  ALKPHOS 89  BILITOT 1.8*  PROT 6.8  ALBUMIN 3.4*   No results for input(s): LIPASE, AMYLASE in the last 168 hours. No results for input(s): AMMONIA in the last 168 hours. Coagulation Profile: No results for input(s): INR, PROTIME in the last 168 hours. Cardiac Enzymes: No results for input(s): CKTOTAL, CKMB, CKMBINDEX, TROPONINI in the last 168 hours. BNP (last 3 results) No results for input(s): PROBNP in the last 8760 hours. CBG: Recent Labs  Lab 11/09/19 1642 11/09/19 2150 11/10/19 0829 11/10/19 1145 11/10/19 1639  GLUCAP 126* 119* 106* 147* 105*   Studies: No results found.   Time spent:  35 minutes  Author: Lynden Oxford, MD [facility] 11/10/2019 6:28 PM  To reach On-call, see care teams to locate the attending and reach out to them via [email or web address]. If 7PM-7AM, please contact night-coverage If you still have difficulty reaching the attending provider, please page the [facility] (Director on Call) for [facility] on amion for assistance.

## 2019-11-10 NOTE — Consult Note (Signed)
Consultation Note Date: 11/10/2019   Patient Name: Tony Flores  DOB: 01/08/1930  MRN: [id number]  Age / Sex: 85 y.o., male  PCP: Patient, No Pcp Per Referring Physician: Lavina Hamman, MD  Reason for Consultation: Establishing goals of care  HPI/Patient Profile: 85 y.o. male  with past medical history of COPD (stable not on any maintenance therapy outpatient), DM, HTN admitted on 11/06/2019 with fall and altered mental status, complaining of low back pain. Found to have fracture at L1/2 disc space- no loss of sensation or function.  Neurosurgery and recommending surgery. Encephalopathy of unclear origin. Lactic acid elevated but improved with hydration. Per report he does have history of cognitive impairment. He is from [facility] independent living facility. Psych consulted and determined patient does not have capacity for medical decision making. Palliative medicine consulted for [place].   Clinical Assessment and Goals of Care: I evaluated patient at bedside. He tells me he is in the hospital because he fell at [facility] as he was trying to get on the bus (per chart review this happened approx April this year- indicated in 07/06/2019 Care Everywhere note by Dr. Carrie Mew). He cannot tell me where we are or the current date.  He denies pain, notes he is "feeling much better". He does not appear to be distressed. When asked about possible surgery he states- "that's just something I'll have to think about".  Given psych consult and per my assessment- patient is unable to participate in [place] discussion.  I called his listed contact- Scherrie Bateman for [place] discussion. Left message requesting return call.   Primary Decision Maker NEXT OF KIN- Scherrie Bateman (stepson)    SUMMARY OF RECOMMENDATIONS -Continue full scope care -PMT will await call back from surrogate decision maker   Code Status/Advance Care  Planning:  Full code   Prognosis:    Unable to determine  Discharge Planning: To Be Determined  Primary Diagnoses: Present on Admission: . Acute metabolic encephalopathy . Closed fracture dislocation of lumbar spine ([place])   I have reviewed the medical record, interviewed the patient and family, and examined the patient. The following aspects are pertinent.  Past Medical History:  Diagnosis Date  . COPD (chronic obstructive pulmonary disease) ([place])   . Diabetes mellitus without complication ([place])   . Hypertension   . Tobacco use    Social History   Socioeconomic History  . Marital status: Widowed    Spouse name: Not on file  . Number of children: Not on file  . Years of education: Not on file  . Highest education level: Not on file  Occupational History  . Not on file  Social Needs  . Financial resource strain: Not on file  . Food insecurity    Worry: Not on file    Inability: Not on file  . Transportation needs    Medical: Not on file    Non-medical: Not on file  Tobacco Use  . Smoking status: Current Every Day Smoker    Packs/day: 1.00  Substance and Sexual Activity  . Alcohol use: Not Currently  . Drug use: Never  . Sexual activity: Not on file  Lifestyle  . Physical activity    Days per week: Not on file    Minutes per session: Not on file  . Stress: Not on file  Relationships  . Social Musician on phone: Not on file    Gets together: Not on file    Attends religious service: Not on file    Active member of club or organization: Not on file    Attends meetings of clubs or organizations: Not on file    Relationship status: Not on file  Other Topics Concern  . Not on file  Social History Narrative  . Not on file   Family History  Problem Relation Age of Onset  . Diabetes Mother   . Heart attack Mother   . Stroke Mother   . Diabetes Father   . Heart attack Father    Scheduled Meds: . enoxaparin (LOVENOX) injection  40 mg  Subcutaneous Q24H  . insulin aspart  0-9 Units Subcutaneous TID WC  . ketotifen  1 drop Left Eye BID  . polyethylene glycol  17 g Oral Daily   Continuous Infusions: . [START ON 11/14/2019]  ceFAZolin (ANCEF) IV     PRN Meds:.acetaminophen **OR** acetaminophen, HYDROcodone-acetaminophen, morphine injection Medications Prior to Admission:  Prior to Admission medications   Medication Sig Start Date End Date Taking? Authorizing Provider  atenolol (TENORMIN) 50 MG tablet Take 50 mg by mouth daily. 10/27/19  Yes [provider]  glimepiride (AMARYL) 2 MG tablet Take 2 mg by mouth daily. 10/10/19  Yes [provider]  hydrochlorothiazide (HYDRODIURIL) 25 MG tablet Take 25 mg by mouth daily. 09/08/19  Yes [provider]  metFORMIN (GLUCOPHAGE-XR) 750 MG 24 hr tablet Take 750 mg by mouth 2 (two) times daily. 05/29/19  Yes [provider]   No Known Allergies Review of Systems  Unable to perform ROS: Mental status change    Physical Exam Vitals signs and nursing note reviewed.  Constitutional:      Appearance: Normal appearance.  Cardiovascular:     Rate and Rhythm: Normal rate.  Pulmonary:     Effort: Pulmonary effort is normal.  Skin:    General: Skin is warm and dry.  Neurological:     Mental Status: He is alert.     Vital Signs: BP 139/86 (BP Location: Right Arm)   Pulse 86   Temp 98.2 F (36.8 C) (Oral)   Resp 17   Ht 5\' 10"  (1.778 m)   Wt 70.3 kg   SpO2 94%   BMI 22.24 kg/m  Pain Scale: 0-10   Pain Score: Asleep   SpO2: SpO2: 94 % O2 Device:SpO2: 94 % O2 Flow Rate: .O2 Flow Rate (L/min): 2 L/min  IO: Intake/output summary: No intake or output data in the 24 hours ending 11/10/19 1217  LBM: Last BM Date: (no BM charted) Baseline Weight: Weight: 59 kg Most recent weight: Weight: 70.3 kg     Palliative Assessment/Data: PPS: 50%     Thank you for this consult. Palliative medicine will continue to follow and assist as needed.    Time In: 1000 Time Out: 1100 Time Total: 60 mins Greater than 50%  of this time was spent counseling and coordinating care related to the above assessment and plan.  Signed by: 11/12/19, AGNP-C Palliative Medicine    Please contact Palliative  Medicine Team phone at [phone] for questions and concerns.  For individual provider: See Shea Evans

## 2019-11-10 NOTE — TOC Progression Note (Signed)
Transition of Care [facility]) - Progression Note    Patient Details  Name: Tony Flores MRN: [id number] Date of Birth: 1930-08-12  [facility]) CM/SW Contact  Su Hilt, RN Phone Number: 11/10/2019, 12:20 PM  Clinical Narrative:    Received a message from Dr Posey Pronto, The [facility]schfeld does not want to be responsible for making all decisions for the patient but will say yes to the needed surgery and the patient going to [facility] after surgery.  I called and left a VM for [place] [facility] requesting a call back to appoint a guardian as the patient has been deemed not to have capacity for decision making. Awaiting a call back from APS        Expected Discharge Plan and Services                                                 Social Determinants of Health (SDOH) Interventions    Readmission Risk Interventions No flowsheet data found.

## 2019-11-10 NOTE — Progress Notes (Signed)
Physical Therapy Discharge Patient Details Name: BRISTON LAX MRN: [id number] DOB: 1930-10-13 Today's Date: 11/10/2019 Time:  -     Patient discharged from PT services secondary to MD orders.  New orders noted today to DC PT at this time.   Chesley Noon 11/10/2019, 8:03 AM

## 2019-11-11 LAB — BASIC METABOLIC PANEL
Anion gap: 10 (ref 5–15)
BUN: 16 mg/dL (ref 8–23)
CO2: 31 mmol/L (ref 22–32)
Calcium: 8.6 mg/dL — ABNORMAL LOW (ref 8.9–10.3)
Chloride: 103 mmol/L (ref 98–111)
Creatinine, Ser: 0.59 mg/dL — ABNORMAL LOW (ref 0.61–1.24)
GFR calc Af Amer: >60 mL/min (ref >60)
GFR calc non Af Amer: >60 mL/min (ref >60)
Glucose, Bld: 137 mg/dL — ABNORMAL HIGH (ref 70–99)
Potassium: 4.2 mmol/L (ref 3.5–5.1)
Sodium: 144 mmol/L (ref 135–145)

## 2019-11-11 LAB — GLUCOSE, CAPILLARY
Glucose-Capillary: 128 mg/dL — ABNORMAL HIGH (ref 70–99)
Glucose-Capillary: 128 mg/dL — ABNORMAL HIGH (ref 70–99)
Glucose-Capillary: 115 mg/dL — ABNORMAL HIGH (ref 70–99)
Glucose-Capillary: 116 mg/dL — ABNORMAL HIGH (ref 70–99)

## 2019-11-11 LAB — CBC
HCT: 37.6 % — ABNORMAL LOW (ref 39.0–52.0)
Hemoglobin: 12.8 g/dL — ABNORMAL LOW (ref 13.0–17.0)
MCH: 29.8 pg (ref 26.0–34.0)
MCHC: 34 g/dL (ref 30.0–36.0)
MCV: 87.6 fL (ref 80.0–100.0)
Platelets: 183 10*3/uL (ref 150–400)
RBC: 4.29 MIL/uL (ref 4.22–5.81)
RDW: 13.2 % (ref 11.5–15.5)
WBC: 5.3 10*3/uL (ref 4.0–10.5)
nRBC: 0 % (ref 0.0–0.2)

## 2019-11-11 LAB — MAGNESIUM: Magnesium: 2 mg/dL (ref 1.7–2.4)

## 2019-11-11 NOTE — Progress Notes (Signed)
[facility] Progress Note  Patient: Tony Flores FTD:[id number]   PCP: Patient, No Pcp Per DOB: 10-02-1930   DOA: 11/06/2019   DOS: 11/11/2019   Date of Service: the patient was seen and examined on 11/11/2019  Chief Complaint  Patient presents with  . Altered Mental Status   Brief hospital course: Pt. with PMH of type 2 diabetes, hypertension, COPD, and tobacco use ; presented with complain of confusion, was found to have progressive dementia as well as subacute L1-L2 disc space fracture.  Currently further plan is continue IV hydration, patient will require surgery for his L-spine displaced fracture currently does not have medical decision-making capacity and does not have healthcare power of attorney or proxy and social worker is working on the case to arrange 1.  Subjective: continues to have back pain. No nausea no vomiting no fever no chills. No diarrhea no constipation.  Assessment and Plan: Scheduled Meds: . enoxaparin (LOVENOX) injection  40 mg Subcutaneous Q24H  . influenza vaccine adjuvanted  0.5 mL Intramuscular Tomorrow-1000  . insulin aspart  0-9 Units Subcutaneous TID WC  . ketotifen  1 drop Left Eye BID  . pneumococcal 23 valent vaccine  0.5 mL Intramuscular Tomorrow-1000  . polyethylene glycol  17 g Oral Daily   Continuous Infusions: . [START ON 11/14/2019]  ceFAZolin (ANCEF) IV     PRN Meds: acetaminophen **OR** acetaminophen, HYDROcodone-acetaminophen, morphine injection Acute encephalopathy: improving  - likely multifactorial although suspect this is just progression of his cognitive impairment. No obvious infectious cause.  He was dehydrated on examination. And was given IV fluids. CT head is without acute findings, does show chronic ischemic microvascular white matter disease. normal TSH, low B12, UDS neg  L1-L2 disc space fracture s/p falls Reported multiple falls over the last week without obvious significant injury. -PT/OT recommends STR/SNF  - Fall precautions - CT lumbar spine shows L1-L2 disc space fracture - appreciate Neurosurgery input, poor operative candidate - TLSO in place for now.  - per neurosurgery - no ambulation as unstable fracture, can raise HOB to 45 degress Per psych patient doesn't have decision making capacity - pain mgmt as need - Concern is that the patient does not have any designated medical power of attorney or healthcare proxy. Doreene Adas is per my discussion with the neurosurgery unable to make decisions regarding patient's goals of care or CODE STATUS but wants to pursue surgery if feasible. Given the nature and the extensiveness of the surgery currently neurosurgery feels that it is appropriate to establish a proper medical power of attorney for the patient. Social worker consulted to assist with the case.  APS report has been initially filed.  Hypokalemia: Repleted & resolved  COPD: Chronic and stable without active wheezing or hypoxia. Not requiring maintenance therapy as an outpatient. Continue monitor.  Hypertension: Currently normotensive. HoldingHCTZ as above, hold atenolol for now.  Type 2 diabetes: On Metformin and Amaryl as an outpatient.  sensitive SSI while in hospital.  Tobacco use: Reports ongoing tobacco use and smoking 1 pack/day  Diet: Carb modified diet  DVT Prophylaxis: Subcutaneous Lovenox   Advance goals of care discussion: Full code  Family Communication: no family was present at bedside, at the time of interview.  Unable to reach son on the phone.  Disposition:  Discharge to be determined .  Consultants: Neurosurgery, psychiatry Procedures: none  Antibiotics: Anti-infectives (From admission, onward)   Start     Dose/Rate Route Frequency Ordered Stop   11/14/19 0900  ceFAZolin (ANCEF) IVPB  2g/100 mL premix     2 g 200 mL/hr over 30 Minutes Intravenous  Once 11/09/19 0912         Objective: Physical Exam: Vitals:   11/10/19 0825 11/10/19 1607  11/11/19 0001 11/11/19 0751  BP: 139/86 (!) 164/88 118/72 120/79  Pulse: 86 90 76 75  Resp: 17 17 19 18   Temp: 98.2 F (36.8 C) 98.2 F (36.8 C) 99.3 F (37.4 C) 99 F (37.2 C)  TempSrc: Oral Oral Oral Oral  SpO2: 94% 96% 92% 94%  Weight:      Height:        Intake/Output Summary (Last 24 hours) at 11/11/2019 1213 Last data filed at 11/11/2019 1127 Gross per 24 hour  Intake 240 ml  Output -  Net 240 ml   Filed Weights   11/06/19 1651 11/07/19 0530  Weight: 59 kg 70.3 kg   General: alert and oriented to place and person. Appear in mild distress, affect appropriate Eyes: PERRL, Conjunctiva normal ENT: Oral Mucosa Clear, moist  Neck: no JVD, no Abnormal Mass Or lumps Cardiovascular: S1 and S2 Present, no Murmur,  Respiratory: good respiratory effort, Bilateral Air entry equal and Decreased, no signs of accessory muscle use, Clear to Auscultation, no Crackles, no wheezes Abdomen: Bowel Sound present, Soft and no tenderness, no hernia Skin: no rashes  Extremities: no Pedal edema, no calf tenderness Neurologic: without any new focal findings Gait not checked due to patient safety concerns  Data Reviewed: I have personally reviewed and interpreted daily labs, tele strips, imagings as discussed above. I reviewed all nursing notes, pharmacy notes, vitals, pertinent old records I have discussed plan of care as described above with RN and patient/family.  CBC: Recent Labs  Lab 11/06/19 1706 11/07/19 0632 11/08/19 0519 11/09/19 0504 11/11/19 0426  WBC 10.5 9.0 5.7 6.1 5.3  NEUTROABS 9.5*  --   --   --   --   HGB 14.2 13.1 12.0* 12.1* 12.8*  HCT 42.2 38.4* 37.2* 35.9* 37.6*  MCV 88.3 86.5 91.4 87.6 87.6  PLT 148* 145* 143* 158 169   Basic Metabolic Panel: Recent Labs  Lab 11/06/19 1706 11/06/19 1921 11/07/19 0632 11/08/19 0519 11/09/19 0504 11/11/19 0426  NA 138  --  142 143 140 144  K 3.1*  --  3.5 3.7 3.6 4.2  CL 95*  --  103 106 105 103  CO2 28  --  26 27  25 31   GLUCOSE 188*  --  165* 96 151* 137*  BUN 24*  --  25* 21 21 16   CREATININE 0.86  --  0.62 0.68 0.59* 0.59*  CALCIUM 8.8*  --  8.3* 8.2* 8.3* 8.6*  MG  --  1.8  --   --   --  2.0  PHOS  --  3.2  --   --   --   --     Liver Function Tests: Recent Labs  Lab 11/06/19 1706  AST 36  ALT 22  ALKPHOS 89  BILITOT 1.8*  PROT 6.8  ALBUMIN 3.4*   No results for input(s): LIPASE, AMYLASE in the last 168 hours. No results for input(s): AMMONIA in the last 168 hours. Coagulation Profile: No results for input(s): INR, PROTIME in the last 168 hours. Cardiac Enzymes: No results for input(s): CKTOTAL, CKMB, CKMBINDEX, TROPONINI in the last 168 hours. BNP (last 3 results) No results for input(s): PROBNP in the last 8760 hours. CBG: Recent Labs  Lab 11/10/19 1145 11/10/19 1639 11/10/19 2222  11/11/19 0804 11/11/19 1144  GLUCAP 147* 105* 139* 128* 128*   Studies: No results found.   Time spent: 35 minutes  Author: Lynden OxfordPranav Detron Carras, MD [facility] 11/11/2019 12:13 PM  To reach On-call, see care teams to locate the attending and reach out to them via [email or web address]. If 7PM-7AM, please contact night-coverage If you still have difficulty reaching the attending provider, please page the [facility] (Director on Call) for [facility] on amion for assistance.

## 2019-11-11 NOTE — Care Management Important Message (Signed)
Important Message  Patient Details  Name: Tony Flores MRN: [id number] Date of Birth: August 02, 1930   Medicare Important Message Given:  Other (see comment)  Patient not able to make decisions for himself and RN CM has contacted DSS for guardianship.  Juliann Pulse A Chakia Counts 11/11/2019, 8:39 AM

## 2019-11-11 NOTE — Progress Notes (Signed)
Daily Progress Note   Patient Name: Tony Flores       Date: 11/11/2019 DOB: 07-12-1930  Age: 85 y.o. MRN#: [id number] Attending Physician: Lavina Hamman, MD Primary Care Physician: Patient, No Pcp Per Admit Date: 11/06/2019  Reason for Consultation/Follow-up: Establishing goals of care  Subjective: Notes reviewed. Patient is resting in bed. He is confused stating we are at "[place]". When asked the year he stated "when I wake up." Called to speak with his brother [name] with no answer. No answer when attempting to reach [place], his listed contact. VM left for [place]. Per CM, attempting to obtain a legal guardian.    Length of Stay: 4  Current Medications: Scheduled Meds:  . enoxaparin (LOVENOX) injection  40 mg Subcutaneous Q24H  . influenza vaccine adjuvanted  0.5 mL Intramuscular Tomorrow-1000  . insulin aspart  0-9 Units Subcutaneous TID WC  . ketotifen  1 drop Left Eye BID  . pneumococcal 23 valent vaccine  0.5 mL Intramuscular Tomorrow-1000  . polyethylene glycol  17 g Oral Daily    Continuous Infusions: . [START ON 11/14/2019]  ceFAZolin (ANCEF) IV      PRN Meds: acetaminophen **OR** acetaminophen, HYDROcodone-acetaminophen, morphine injection  Physical Exam Pulmonary:     Effort: Pulmonary effort is normal.  Neurological:     Mental Status: He is alert.     Comments: Confused             Vital Signs: BP 120/79 (BP Location: Right Arm)   Pulse 75   Temp 99 F (37.2 C) (Oral)   Resp 18   Ht 5\' 10"  (1.778 m)   Wt 70.3 kg   SpO2 94%   BMI 22.24 kg/m  SpO2: SpO2: 94 % O2 Device: O2 Device: Room Air O2 Flow Rate: O2 Flow Rate (L/min): 2 L/min  Intake/output summary:   Intake/Output Summary (Last 24 hours) at 11/11/2019 1504 Last data filed at 11/11/2019  1401 Gross per 24 hour  Intake 360 ml  Output -  Net 360 ml   LBM: Last BM Date: (unknown ) Baseline Weight: Weight: 59 kg Most recent weight: Weight: 70.3 kg       Palliative Assessment/Data:      Patient Active Problem List   Diagnosis Date Noted  . Advanced care planning/counseling discussion   . Palliative  care by specialist   . Encounter for competency evaluation   . Acute low back pain without sciatica   . Closed fracture dislocation of lumbar spine (HCC) 11/07/2019  . Confusion   . Dehydration   . Acute metabolic encephalopathy 11/06/2019  . Type 2 diabetes mellitus (HCC) 11/06/2019  . Hypertension associated with diabetes (HCC) 11/06/2019  . COPD (chronic obstructive pulmonary disease) (HCC) 11/06/2019  . Hypokalemia 11/06/2019    Palliative Care Assessment & Plan   Recommendations/Plan:  Patient confused, and unable to reach next of kin.   CM working on legal guardian.     Code Status:    Code Status Orders  (From admission, onward)         Start     Ordered   11/06/19 2318  Full code  Continuous     11/06/19 2319        Code Status History    This patient has a current code status but no historical code status.   Advance Care Planning Activity       Prognosis:   Unable to determine  Discharge Planning:  To Be Determined  Care plan was discussed with CM  Thank you for allowing the Palliative Medicine Team to assist in the care of this patient.   Total Time 25 min Prolonged Time Billed  no      Greater than 50%  of this time was spent counseling and coordinating care related to the above assessment and plan.  Morton Stall, NP  Please contact Palliative Medicine Team phone at [phone] for questions and concerns.

## 2019-11-11 NOTE — TOC Progression Note (Signed)
Transition of Care [facility]) - Progression Note    Patient Details  Name: Tony Flores MRN: [id number] Date of Birth: [birth year]  [facility]) CM/SW Contact  Su Hilt, RN Phone Number: 11/11/2019, 3:15 PM  Clinical Narrative:    Called APS again and left another message for APS to please return my call to discuss them taking guardianship of the patient, I Left my name and contact number        Expected Discharge Plan and Services                                                 Social Determinants of Health (SDOH) Interventions    Readmission Risk Interventions No flowsheet data found.

## 2019-11-11 NOTE — TOC Progression Note (Signed)
Spoke with Barrie Lyme Marrow with [facility] of [place] at [phone] requested emergent legal  guardianship  To make decisions for the patients care, she took the report and will have someone call back

## 2019-11-11 NOTE — Progress Notes (Signed)
Per Dr. Jonathon Jordan request, order for lumbar films with patient sitting in brace completed.

## 2019-11-11 NOTE — TOC Progression Note (Signed)
Transition of Care [facility]) - Progression Note    Patient Details  Name: Tony Flores MRN: [id number] Date of Birth: Aug 27, 1930  Transition of Care [facility]) CM/SW [place], RN Phone Number: 11/11/2019, 9:07 AM  Clinical Narrative:     Called [facility] of [place] again and left a message requesting a call back, left my contact number        Expected Discharge Plan and Services                                                 Social Determinants of Health (SDOH) Interventions    Readmission Risk Interventions No flowsheet data found.

## 2019-11-12 LAB — CBC
HCT: 38.5 % — ABNORMAL LOW (ref 39.0–52.0)
Hemoglobin: 12.8 g/dL — ABNORMAL LOW (ref 13.0–17.0)
MCH: 29.7 pg (ref 26.0–34.0)
MCHC: 33.2 g/dL (ref 30.0–36.0)
MCV: 89.3 fL (ref 80.0–100.0)
Platelets: 214 10*3/uL (ref 150–400)
RBC: 4.31 MIL/uL (ref 4.22–5.81)
RDW: 13.1 % (ref 11.5–15.5)
WBC: 6.5 10*3/uL (ref 4.0–10.5)
nRBC: 0 % (ref 0.0–0.2)

## 2019-11-12 LAB — BASIC METABOLIC PANEL
Anion gap: 11 (ref 5–15)
BUN: 15 mg/dL (ref 8–23)
CO2: 27 mmol/L (ref 22–32)
Calcium: 8.3 mg/dL — ABNORMAL LOW (ref 8.9–10.3)
Chloride: 101 mmol/L (ref 98–111)
Creatinine, Ser: 0.56 mg/dL — ABNORMAL LOW (ref 0.61–1.24)
GFR calc Af Amer: >60 mL/min (ref >60)
GFR calc non Af Amer: >60 mL/min (ref >60)
Glucose, Bld: 138 mg/dL — ABNORMAL HIGH (ref 70–99)
Potassium: 3.7 mmol/L (ref 3.5–5.1)
Sodium: 139 mmol/L (ref 135–145)

## 2019-11-12 LAB — GLUCOSE, CAPILLARY
Glucose-Capillary: 117 mg/dL — ABNORMAL HIGH (ref 70–99)
Glucose-Capillary: 103 mg/dL — ABNORMAL HIGH (ref 70–99)
Glucose-Capillary: 155 mg/dL — ABNORMAL HIGH (ref 70–99)
Glucose-Capillary: 129 mg/dL — ABNORMAL HIGH (ref 70–99)

## 2019-11-12 NOTE — Progress Notes (Signed)
[facility] Progress Note  Patient: Tony MalkinJames A Flores ZOX:[id number]   PCP: Patient, No Pcp Per DOB: 04/23/1930   DOA: 11/06/2019   DOS: 11/12/2019   Date of Service: the patient was seen and examined on 11/12/2019  Chief Complaint  Patient presents with  . Altered Mental Status   Brief hospital course: Pt. with PMH of type 2 diabetes, hypertension, COPD, and tobacco use ; presented with complain of confusion, was found to have progressive dementia as well as subacute L1-L2 disc space fracture.  Currently further plan is continue IV hydration, patient will require surgery for his L-spine displaced fracture currently does not have medical decision-making capacity and does not have healthcare power of attorney or proxy and social worker is working on the case to arrange 1.  Subjective: Poor p.o. intake. No nausea no vomiting.  Assessment and Plan: Scheduled Meds: . enoxaparin (LOVENOX) injection  40 mg Subcutaneous Q24H  . influenza vaccine adjuvanted  0.5 mL Intramuscular Tomorrow-1000  . insulin aspart  0-9 Units Subcutaneous TID WC  . ketotifen  1 drop Left Eye BID  . pneumococcal 23 valent vaccine  0.5 mL Intramuscular Tomorrow-1000  . polyethylene glycol  17 g Oral Daily   Continuous Infusions: . [START ON 11/14/2019]  ceFAZolin (ANCEF) IV     PRN Meds: acetaminophen **OR** acetaminophen, HYDROcodone-acetaminophen, morphine injection Acute encephalopathy: improving  - likely multifactorial although suspect this is just progression of his cognitive impairment. No obvious infectious cause.  He was dehydrated on examination. And was given IV fluids. CT head is without acute findings, does show chronic ischemic microvascular white matter disease. normal TSH, low B12, UDS neg  L1-L2 disc space fracture s/p falls Reported multiple falls over the last week without obvious significant injury. -PT/OT recommends STR/SNF - Fall precautions - CT lumbar spine shows L1-L2 disc  space fracture - appreciate Neurosurgery input, poor operative candidate - TLSO in place for now.  - per neurosurgery - no ambulation as unstable fracture, can raise HOB to 45 degress Per psych patient doesn't have decision making capacity - pain mgmt as need - Concern is that the patient does not have any designated medical power of attorney or healthcare proxy. Doreene AdasStepson is per my discussion with the neurosurgery unable to make decisions regarding patient's goals of care or CODE STATUS but wants to pursue surgery if feasible. Given the nature and the extensiveness of the surgery currently neurosurgery feels that it is appropriate to establish a proper medical power of attorney for the patient. Social worker consulted to assist with the case.  APS report has been initially filed.  Hypokalemia: Repleted & resolved  COPD: Chronic and stable without active wheezing or hypoxia. Not requiring maintenance therapy as an outpatient. Continue monitor.  Hypertension: Currently normotensive. HoldingHCTZ as above, hold atenolol for now.  Type 2 diabetes: Controlled without any complication. On Metformin and Amaryl as an outpatient.  sensitive SSI while in hospital.  Tobacco use: Reports ongoing tobacco use and smoking 1 pack/day Currently tolerating without nicotine patch.  Diet: Carb modified diet  DVT Prophylaxis: Subcutaneous Lovenox   Advance goals of care discussion: Full code  Family Communication: no family was present at bedside, at the time of interview.  Unable to reach son on the phone.  Disposition:  Discharge to be determined .  Consultants: Neurosurgery, psychiatry Procedures: none  Antibiotics: Anti-infectives (From admission, onward)   Start     Dose/Rate Route Frequency Ordered Stop   11/14/19 0900  ceFAZolin (ANCEF) IVPB 2g/100  mL premix     2 g 200 mL/hr over 30 Minutes Intravenous  Once 11/09/19 0912         Objective: Physical Exam: Vitals:    11/12/19 0406 11/12/19 0504 11/12/19 0738 11/12/19 1524  BP: (!) 173/98 131/87 134/84 134/81  Pulse: 90 89 91 95  Resp: 18     Temp: 97.6 F (36.4 C)  97.6 F (36.4 C) 98.3 F (36.8 C)  TempSrc: Oral  Oral Oral  SpO2: 94%  97% 93%  Weight:      Height:        Intake/Output Summary (Last 24 hours) at 11/12/2019 1735 Last data filed at 11/12/2019 1300 Gross per 24 hour  Intake 240 ml  Output -  Net 240 ml   Filed Weights   11/06/19 1651 11/07/19 0530  Weight: 59 kg 70.3 kg   General: alert and oriented to place and person. Appear in mild distress, affect appropriate Eyes: PERRL, Conjunctiva normal ENT: Oral Mucosa Clear, moist  Neck: no JVD, no Abnormal Mass Or lumps Cardiovascular: S1 and S2 Present, no Murmur,  Respiratory: good respiratory effort, Bilateral Air entry equal and Decreased, no signs of accessory muscle use, Clear to Auscultation, no Crackles, no wheezes Abdomen: Bowel Sound present, Soft and no tenderness, no hernia Skin: no rashes  Extremities: no Pedal edema, no calf tenderness Neurologic: without any new focal findings Gait not checked due to patient safety concerns  Data Reviewed: I have personally reviewed and interpreted daily labs, tele strips, imagings as discussed above. I reviewed all nursing notes, pharmacy notes, vitals, pertinent old records I have discussed plan of care as described above with RN and patient/family.  CBC: Recent Labs  Lab 11/06/19 1706 11/07/19 1448 11/08/19 0519 11/09/19 0504 11/11/19 0426 11/12/19 0431  WBC 10.5 9.0 5.7 6.1 5.3 6.5  NEUTROABS 9.5*  --   --   --   --   --   HGB 14.2 13.1 12.0* 12.1* 12.8* 12.8*  HCT 42.2 38.4* 37.2* 35.9* 37.6* 38.5*  MCV 88.3 86.5 91.4 87.6 87.6 89.3  PLT 148* 145* 143* 158 183 214   Basic Metabolic Panel: Recent Labs  Lab 11/06/19 1921 11/07/19 0632 11/08/19 0519 11/09/19 0504 11/11/19 0426 11/12/19 0431  NA  --  142 143 140 144 139  K  --  3.5 3.7 3.6 4.2 3.7  CL   --  103 106 105 103 101  CO2  --  26 27 25 31 27   GLUCOSE  --  165* 96 151* 137* 138*  BUN  --  25* 21 21 16 15   CREATININE  --  0.62 0.68 0.59* 0.59* 0.56*  CALCIUM  --  8.3* 8.2* 8.3* 8.6* 8.3*  MG 1.8  --   --   --  2.0  --   PHOS 3.2  --   --   --   --   --     Liver Function Tests: Recent Labs  Lab 11/06/19 1706  AST 36  ALT 22  ALKPHOS 89  BILITOT 1.8*  PROT 6.8  ALBUMIN 3.4*   No results for input(s): LIPASE, AMYLASE in the last 168 hours. No results for input(s): AMMONIA in the last 168 hours. Coagulation Profile: No results for input(s): INR, PROTIME in the last 168 hours. Cardiac Enzymes: No results for input(s): CKTOTAL, CKMB, CKMBINDEX, TROPONINI in the last 168 hours. BNP (last 3 results) No results for input(s): PROBNP in the last 8760 hours. CBG: Recent Labs  Lab  11/11/19 1144 11/11/19 1727 11/11/19 2113 11/12/19 0739 11/12/19 1146  GLUCAP 128* 115* 116* 117* 103*   Studies: No results found.   Time spent: 35 minutes  Author: Berle Mull, MD [facility] 11/12/2019 5:35 PM  To reach On-call, see care teams to locate the attending and reach out to them via [email or web address]. If 7PM-7AM, please contact night-coverage If you still have difficulty reaching the attending provider, please page the [facility] (Director on Call) for [facility] on amion for assistance.

## 2019-11-12 NOTE — Plan of Care (Signed)

## 2019-11-13 DIAGNOSIS — Z515 Encounter for palliative care: Secondary | ICD-10-CM

## 2019-11-13 LAB — BASIC METABOLIC PANEL
Anion gap: 12 (ref 5–15)
BUN: 17 mg/dL (ref 8–23)
CO2: 27 mmol/L (ref 22–32)
Calcium: 8.6 mg/dL — ABNORMAL LOW (ref 8.9–10.3)
Chloride: 101 mmol/L (ref 98–111)
Creatinine, Ser: 0.53 mg/dL — ABNORMAL LOW (ref 0.61–1.24)
GFR calc Af Amer: >60 mL/min (ref >60)
GFR calc non Af Amer: >60 mL/min (ref >60)
Glucose, Bld: 143 mg/dL — ABNORMAL HIGH (ref 70–99)
Potassium: 3.8 mmol/L (ref 3.5–5.1)
Sodium: 140 mmol/L (ref 135–145)

## 2019-11-13 LAB — CBC
HCT: 37.7 % — ABNORMAL LOW (ref 39.0–52.0)
Hemoglobin: 12.9 g/dL — ABNORMAL LOW (ref 13.0–17.0)
MCH: 29.6 pg (ref 26.0–34.0)
MCHC: 34.2 g/dL (ref 30.0–36.0)
MCV: 86.5 fL (ref 80.0–100.0)
Platelets: 199 10*3/uL (ref 150–400)
RBC: 4.36 MIL/uL (ref 4.22–5.81)
RDW: 13.3 % (ref 11.5–15.5)
WBC: 7 10*3/uL (ref 4.0–10.5)
nRBC: 0 % (ref 0.0–0.2)

## 2019-11-13 LAB — GLUCOSE, CAPILLARY
Glucose-Capillary: 137 mg/dL — ABNORMAL HIGH (ref 70–99)
Glucose-Capillary: 157 mg/dL — ABNORMAL HIGH (ref 70–99)
Glucose-Capillary: 141 mg/dL — ABNORMAL HIGH (ref 70–99)

## 2019-11-13 MED ORDER — SENNOSIDES-DOCUSATE SODIUM 8.6-50 MG PO TABS
1.0000 | ORAL_TABLET | Freq: Two times a day (BID) | ORAL | Status: DC
  Administered 2019-11-13 – 2019-11-24 (×21): 1 via ORAL
  Filled 2019-11-13 (×23): qty 1

## 2019-11-13 MED ORDER — BISACODYL 10 MG RE SUPP
10.0000 mg | Freq: Every day | RECTAL | Status: DC | PRN
  Administered 2019-11-19: 13:00:00 10 mg via RECTAL
  Filled 2019-11-13: qty 1

## 2019-11-13 NOTE — Progress Notes (Signed)
[facility] Progress Note  Patient: Tony MalkinJames A Flores [id number]   PCP: Patient, No Pcp Per DOB: 07/24/1930   DOA: 11/06/2019   DOS: 11/13/2019   Date of Service: the patient was seen and examined on 11/13/2019  Chief Complaint  Patient presents with  . Altered Mental Status   Brief hospital course: Pt. with PMH of type 2 diabetes, hypertension, COPD, and tobacco use ; presented with complain of confusion, was found to have progressive dementia as well as subacute L1-L2 disc space fracture.  Currently further plan is continue IV hydration, patient will require surgery for his L-spine displaced fracture currently does not have medical decision-making capacity and does not have healthcare power of attorney or proxy and social worker is working on the case to arrange 1.  Subjective: no back pain has some constipation.  Assessment and Plan: Scheduled Meds: . enoxaparin (LOVENOX) injection  40 mg Subcutaneous Q24H  . influenza vaccine adjuvanted  0.5 mL Intramuscular Tomorrow-1000  . insulin aspart  0-9 Units Subcutaneous TID WC  . ketotifen  1 drop Left Eye BID  . pneumococcal 23 valent vaccine  0.5 mL Intramuscular Tomorrow-1000  . polyethylene glycol  17 g Oral Daily  . senna-docusate  1 tablet Oral BID   Continuous Infusions: . [START ON 11/14/2019]  ceFAZolin (ANCEF) IV     PRN Meds: acetaminophen **OR** acetaminophen, bisacodyl, HYDROcodone-acetaminophen, morphine injection Acute encephalopathy: resolved.  - likely multifactorial although suspect this is just progression of his cognitive impairment. No obvious infectious cause.  He was dehydrated on examination. And was given IV fluids. CT head is without acute findings, does show chronic ischemic microvascular white matter disease. normal TSH, low B12, UDS neg Some agitation.  L1-L2 disc space fracture s/p falls Reported multiple falls over the last week without obvious significant injury. -PT/OT recommends  STR/SNF - Fall precautions - CT lumbar spine shows L1-L2 disc space fracture - appreciate Neurosurgery input, poor operative candidate - TLSO in place for now.  - per neurosurgery - no ambulation as unstable fracture, can raise HOB to 45 degress Per psych patient doesn't have decision making capacity - pain mgmt as need - Concern is that the patient does not have any designated medical power of attorney or healthcare proxy. Doreene AdasStepson is per my discussion with the neurosurgery unable to make decisions regarding patient's goals of care or CODE STATUS but wants to pursue surgery if feasible. Given the nature and the extensiveness of the surgery currently neurosurgery feels that it is appropriate to establish a proper medical power of attorney for the patient. Social worker consulted to assist with the case.   APS report has been filed.  Hypokalemia: Repleted & resolved  COPD: Chronic and stable without active wheezing or hypoxia. Not requiring maintenance therapy as an outpatient. Continue monitor.  Hypertension: Currently normotensive. HoldingHCTZ as above, hold atenolol for now.  Type 2 diabetes: Controlled without any complication. On Metformin and Amaryl as an outpatient.  sensitive SSI while in hospital.  Tobacco use: Reports ongoing tobacco use and smoking 1 pack/day Currently tolerating without nicotine patch.  Constipation  Added stool softner.   Diet: Carb modified diet  DVT Prophylaxis: Subcutaneous Lovenox   Advance goals of care discussion: Full code  Family Communication: no family was present at bedside, at the time of interview.  Unable to reach son on the phone.  Disposition:  Discharge to be determined .  Consultants: Neurosurgery, psychiatry Procedures: none  Antibiotics: Anti-infectives (From admission, onward)   Start  Dose/Rate Route Frequency Ordered Stop   11/14/19 0900  ceFAZolin (ANCEF) IVPB 2g/100 mL premix     2 g 200 mL/hr over 30  Minutes Intravenous  Once 11/09/19 0912         Objective: Physical Exam: Vitals:   11/12/19 1524 11/13/19 0011 11/13/19 0724 11/13/19 1637  BP: 134/81 (!) 158/96 (!) 142/97 (!) 155/98  Pulse: 95 100 96 100  Resp:  20    Temp: 98.3 F (36.8 C) 98.3 F (36.8 C) 97.9 F (36.6 C) 97.9 F (36.6 C)  TempSrc: Oral Oral Oral Oral  SpO2: 93% 93% 94% 93%  Weight:      Height:        Intake/Output Summary (Last 24 hours) at 11/13/2019 1645 Last data filed at 11/13/2019 1525 Gross per 24 hour  Intake 360 ml  Output 400 ml  Net -40 ml   Filed Weights   11/06/19 1651 11/07/19 0530  Weight: 59 kg 70.3 kg   General: alert and oriented to place and person. Appear in mild distress, affect appropriate Eyes: PERRL, Conjunctiva normal ENT: Oral Mucosa Clear, moist  Neck: no JVD, no Abnormal Mass Or lumps Cardiovascular: S1 and S2 Present, no Murmur,  Respiratory: good respiratory effort, Bilateral Air entry equal and Decreased, no signs of accessory muscle use, Clear to Auscultation, no Crackles, no wheezes Abdomen: Bowel Sound present, Soft and no tenderness, no hernia Skin: no rashes  Extremities: no Pedal edema, no calf tenderness Neurologic: without any new focal findings Gait not checked due to patient safety concerns  Data Reviewed: I have personally reviewed and interpreted daily labs, tele strips, imagings as discussed above. I reviewed all nursing notes, pharmacy notes, vitals, pertinent old records I have discussed plan of care as described above with RN and patient/family.  CBC: Recent Labs  Lab 11/06/19 1706  11/08/19 0519 11/09/19 0504 11/11/19 0426 11/12/19 0431 11/13/19 0541  WBC 10.5   < > 5.7 6.1 5.3 6.5 7.0  NEUTROABS 9.5*  --   --   --   --   --   --   HGB 14.2   < > 12.0* 12.1* 12.8* 12.8* 12.9*  HCT 42.2   < > 37.2* 35.9* 37.6* 38.5* 37.7*  MCV 88.3   < > 91.4 87.6 87.6 89.3 86.5  PLT 148*   < > 143* 158 183 214 199   < > = values in this interval not  displayed.   Basic Metabolic Panel: Recent Labs  Lab 11/06/19 1921  11/08/19 0519 11/09/19 0504 11/11/19 0426 11/12/19 0431 11/13/19 0541  NA  --    < > 143 140 144 139 140  K  --    < > 3.7 3.6 4.2 3.7 3.8  CL  --    < > 106 105 103 101 101  CO2  --    < > 27 25 31 27 27   GLUCOSE  --    < > 96 151* 137* 138* 143*  BUN  --    < > 21 21 16 15 17   CREATININE  --    < > 0.68 0.59* 0.59* 0.56* 0.53*  CALCIUM  --    < > 8.2* 8.3* 8.6* 8.3* 8.6*  MG 1.8  --   --   --  2.0  --   --   PHOS 3.2  --   --   --   --   --   --    < > = values  in this interval not displayed.    Liver Function Tests: Recent Labs  Lab 11/06/19 1706  AST 36  ALT 22  ALKPHOS 89  BILITOT 1.8*  PROT 6.8  ALBUMIN 3.4*   No results for input(s): LIPASE, AMYLASE in the last 168 hours. No results for input(s): AMMONIA in the last 168 hours. Coagulation Profile: No results for input(s): INR, PROTIME in the last 168 hours. Cardiac Enzymes: No results for input(s): CKTOTAL, CKMB, CKMBINDEX, TROPONINI in the last 168 hours. BNP (last 3 results) No results for input(s): PROBNP in the last 8760 hours. CBG: Recent Labs  Lab 11/12/19 1700 11/12/19 2100 11/13/19 0726 11/13/19 1156 11/13/19 1635  GLUCAP 155* 129* 137* 157* 141*   Studies: No results found.   Time spent: 35 minutes  Author: Lynden Oxford, MD [facility] 11/13/2019 4:45 PM  To reach On-call, see care teams to locate the attending and reach out to them via [email or web address]. If 7PM-7AM, please contact night-coverage If you still have difficulty reaching the attending provider, please page the [facility] (Director on Call) for [facility] on amion for assistance.

## 2019-11-13 NOTE — Progress Notes (Signed)
FBS 150

## 2019-11-13 NOTE — Plan of Care (Signed)
Very concerned abt patient - he's really not eating or drinking fluids.  Feel like he's given up. He doesn't like the food here.  Is in a lot of pain whenever we reposition him.  Pt is A&O x3 and follows commands.  Maybe Psych would be willing to see him again to see if he can give consent for surgery.

## 2019-11-13 NOTE — Progress Notes (Signed)
Pt confused and will not keep on TLSO brace for his back. Does  try to take his gown off too. No complaints of pain during the night. Pt able to sleep in between care.

## 2019-11-13 NOTE — Progress Notes (Signed)
Pt laying in bed and appears comfortable. Pt did eat half a container of applesauce with his night time medication.

## 2019-11-14 ENCOUNTER — Encounter
Admission: EM | Disposition: A | Discharge status: Hospice | Payer: Self-pay | Source: Skilled Nursing Facility | Attending: Internal Medicine

## 2019-11-14 LAB — BASIC METABOLIC PANEL
Anion gap: 11 (ref 5–15)
BUN: 20 mg/dL (ref 8–23)
CO2: 26 mmol/L (ref 22–32)
Calcium: 8.5 mg/dL — ABNORMAL LOW (ref 8.9–10.3)
Chloride: 104 mmol/L (ref 98–111)
Creatinine, Ser: 0.49 mg/dL — ABNORMAL LOW (ref 0.61–1.24)
GFR calc Af Amer: >60 mL/min (ref >60)
GFR calc non Af Amer: >60 mL/min (ref >60)
Glucose, Bld: 160 mg/dL — ABNORMAL HIGH (ref 70–99)
Potassium: 3.8 mmol/L (ref 3.5–5.1)
Sodium: 141 mmol/L (ref 135–145)

## 2019-11-14 LAB — CBC
HCT: 38 % — ABNORMAL LOW (ref 39.0–52.0)
Hemoglobin: 12.4 g/dL — ABNORMAL LOW (ref 13.0–17.0)
MCH: 29.4 pg (ref 26.0–34.0)
MCHC: 32.6 g/dL (ref 30.0–36.0)
MCV: 90 fL (ref 80.0–100.0)
Platelets: 212 10*3/uL (ref 150–400)
RBC: 4.22 MIL/uL (ref 4.22–5.81)
RDW: 13.3 % (ref 11.5–15.5)
WBC: 6.6 10*3/uL (ref 4.0–10.5)
nRBC: 0 % (ref 0.0–0.2)

## 2019-11-14 LAB — GLUCOSE, CAPILLARY
Glucose-Capillary: 130 mg/dL — ABNORMAL HIGH (ref 70–99)
Glucose-Capillary: 165 mg/dL — ABNORMAL HIGH (ref 70–99)
Glucose-Capillary: 93 mg/dL (ref 70–99)
Glucose-Capillary: 154 mg/dL — ABNORMAL HIGH (ref 70–99)

## 2019-11-14 SURGERY — LUMBAR PERCUTANEOUS PEDICLE SCREW 4 LEVEL
Anesthesia: General

## 2019-11-14 MED ORDER — ENSURE ENLIVE PO LIQD
237.0000 mL | Freq: Three times a day (TID) | ORAL | Status: DC
  Administered 2019-11-14 – 2019-11-16 (×4): 237 mL via ORAL

## 2019-11-14 SURGICAL SUPPLY — 63 items
BLADE BOVIE TIP EXT 4 (BLADE) ×3 IMPLANT
BUR NEURO DRILL SOFT 3.0X3.8M (BURR) ×3 IMPLANT
CANISTER SUCT 1200ML W/VALVE (MISCELLANEOUS) ×6 IMPLANT
CHLORAPREP W/TINT 26 (MISCELLANEOUS) ×3 IMPLANT
CNTNR SPEC 2.5X3XGRAD LEK (MISCELLANEOUS) ×1
CONT SPEC 4OZ STER OR WHT (MISCELLANEOUS) ×2
CONTAINER SPEC 2.5X3XGRAD LEK (MISCELLANEOUS) ×1 IMPLANT
COUNTER NEEDLE 20/40 LG (NEEDLE) ×3 IMPLANT
COVER BACK TABLE REUSABLE LG (DRAPES) ×3 IMPLANT
COVER LIGHT HANDLE STERIS (MISCELLANEOUS) ×6 IMPLANT
COVER WAND RF STERILE (DRAPES) ×3 IMPLANT
CUP MEDICINE 2OZ PLAST GRAD ST (MISCELLANEOUS) ×3 IMPLANT
DERMABOND ADVANCED (GAUZE/BANDAGES/DRESSINGS) ×2
DERMABOND ADVANCED .7 DNX12 (GAUZE/BANDAGES/DRESSINGS) ×1 IMPLANT
DRAPE C-ARM 42X70 (DRAPES) ×6 IMPLANT
DRAPE LAPAROTOMY 100X77 ABD (DRAPES) ×3 IMPLANT
DRAPE MICROSCOPE SPINE 48X150 (DRAPES) ×3 IMPLANT
DRAPE POUCH INSTRU U-SHP 10X18 (DRAPES) ×3 IMPLANT
DRAPE SURG 17X11 SM STRL (DRAPES) ×12 IMPLANT
DURASEAL APPLICATOR TIP (TIP) IMPLANT
DURASEAL SPINE SEALANT 3ML (MISCELLANEOUS) IMPLANT
ELECT CAUTERY BLADE TIP 2.5 (TIP) ×3
ELECT EZSTD 165MM 6.5IN (MISCELLANEOUS) ×3
ELECT REM PT RETURN 9FT ADLT (ELECTROSURGICAL) ×3
ELECTRODE CAUTERY BLDE TIP 2.5 (TIP) ×1 IMPLANT
ELECTRODE EZSTD 165MM 6.5IN (MISCELLANEOUS) ×1 IMPLANT
ELECTRODE REM PT RTRN 9FT ADLT (ELECTROSURGICAL) ×1 IMPLANT
GAUZE SPONGE 4X4 12PLY STRL (GAUZE/BANDAGES/DRESSINGS) ×3 IMPLANT
GLOVE BIOGEL PI IND STRL 7.0 (GLOVE) ×1 IMPLANT
GLOVE BIOGEL PI INDICATOR 7.0 (GLOVE) ×2
GLOVE INDICATOR 8.0 STRL GRN (GLOVE) ×3 IMPLANT
GLOVE SURG SYN 7.0 (GLOVE) ×6 IMPLANT
GLOVE SURG SYN 8.0 (GLOVE) ×6 IMPLANT
GOWN STRL REUS W/ TWL LRG LVL3 (GOWN DISPOSABLE) ×2 IMPLANT
GOWN STRL REUS W/ TWL XL LVL3 (GOWN DISPOSABLE) ×1 IMPLANT
GOWN STRL REUS W/TWL LRG LVL3 (GOWN DISPOSABLE) ×4
GOWN STRL REUS W/TWL XL LVL3 (GOWN DISPOSABLE) ×2
GRADUATE 1200CC STRL 31836 (MISCELLANEOUS) ×3 IMPLANT
IV CATH ANGIO 12GX3 LT BLUE (NEEDLE) ×3 IMPLANT
KIT SPINAL PRONEVIEW (KITS) ×3 IMPLANT
KIT TURNOVER KIT A (KITS) ×3 IMPLANT
MARKER SKIN DUAL TIP RULER LAB (MISCELLANEOUS) ×9 IMPLANT
NDL SAFETY ECLIPSE 18X1.5 (NEEDLE) ×1 IMPLANT
NEEDLE HYPO 18GX1.5 SHARP (NEEDLE) ×2
NEEDLE HYPO 22GX1.5 SAFETY (NEEDLE) ×3 IMPLANT
NS IRRIG 1000ML POUR BTL (IV SOLUTION) ×3 IMPLANT
PACK LAMINECTOMY NEURO (CUSTOM PROCEDURE TRAY) ×3 IMPLANT
PAD ARMBOARD 7.5X6 YLW CONV (MISCELLANEOUS) ×3 IMPLANT
SPOGE SURGIFLO 8M (HEMOSTASIS) ×2
SPONGE KITTNER 5P (MISCELLANEOUS) ×3 IMPLANT
SPONGE SURGIFLO 8M (HEMOSTASIS) ×1 IMPLANT
SUT NURALON 4 0 TR CR/8 (SUTURE) IMPLANT
SUT POLYSORB 2-0 5X18 GS-10 (SUTURE) ×3 IMPLANT
SUT VIC AB 0 CT1 18XCR BRD 8 (SUTURE) IMPLANT
SUT VIC AB 0 CT1 8-18 (SUTURE)
SUT VIC AB 3-0 SH 8-18 (SUTURE) IMPLANT
SYR 10ML LL (SYRINGE) ×6 IMPLANT
SYR 20ML LL LF (SYRINGE) ×3 IMPLANT
SYR 30ML LL (SYRINGE) ×6 IMPLANT
TOWEL OR 17X26 4PK STRL BLUE (TOWEL DISPOSABLE) ×6 IMPLANT
TRAY FOLEY MTR SLVR 16FR STAT (SET/KITS/TRAYS/PACK) IMPLANT
TUBING CONNECTING 10 (TUBING) ×2 IMPLANT
TUBING CONNECTING 10' (TUBING) ×1

## 2019-11-14 NOTE — Plan of Care (Signed)
Patient not participating in care, MD aware.  Supplements ordered, will continue to assess.

## 2019-11-14 NOTE — Progress Notes (Signed)
[facility] Progress Note  Patient: Tony Flores NID:[id number]   PCP: Patient, No Pcp Per DOB: May 24, 1930   DOA: 11/06/2019   DOS: 11/14/2019   Date of Service: the patient was seen and examined on 11/14/2019  Chief Complaint  Patient presents with  . Altered Mental Status   Brief hospital course: Pt. with PMH of type 2 diabetes, hypertension, COPD, and tobacco use ; presented with complain of confusion, was found to have progressive dementia as well as subacute L1-L2 disc space fracture.  Currently further plan is continue IV hydration, patient will require surgery for his L-spine displaced fracture currently does not have medical decision-making capacity and does not have healthcare power of attorney or proxy and social worker is working on the case to arrange 1.  Subjective: Constipation remains an issue.  Nurse reports that the patient actually is now having poor p.o. intake.  No nausea no vomiting.  At the time of my evaluation patient was actually trying to eat peach.  Assessment and Plan: Scheduled Meds: . enoxaparin (LOVENOX) injection  40 mg Subcutaneous Q24H  . feeding supplement (ENSURE ENLIVE)  237 mL Oral TID BM  . influenza vaccine adjuvanted  0.5 mL Intramuscular Tomorrow-1000  . insulin aspart  0-9 Units Subcutaneous TID WC  . ketotifen  1 drop Left Eye BID  . pneumococcal 23 valent vaccine  0.5 mL Intramuscular Tomorrow-1000  . polyethylene glycol  17 g Oral Daily  . senna-docusate  1 tablet Oral BID   Continuous Infusions:  PRN Meds: acetaminophen **OR** acetaminophen, bisacodyl, HYDROcodone-acetaminophen, morphine injection Acute encephalopathy: resolved.  - likely multifactorial although suspect this is just progression of his cognitive impairment. No obvious infectious cause.  He was dehydrated on examination. And was given IV fluids. CT head is without acute findings, does show chronic ischemic microvascular white matter disease. normal TSH, low  B12, UDS neg Occasional agitation.  L1-L2 disc space fracture s/p falls Reported multiple falls over the last week without obvious significant injury. -PT/OT recommends STR/SNF - Fall precautions - CT lumbar spine shows L1-L2 disc space fracture - appreciate Neurosurgery input, poor operative candidate - TLSO in place for now.  - per neurosurgery - no ambulation as unstable fracture, can raise HOB to 45 degress Per psych patient doesn't have decision making capacity - pain mgmt as need - Concern is that the patient does not have any designated medical power of attorney or healthcare proxy. Joslyn Hy is per my discussion with the neurosurgery unable to make decisions regarding patient's goals of care or CODE STATUS but wants to pursue surgery if feasible. Given the nature and the extensiveness of the surgery currently neurosurgery feels that it is appropriate to establish a proper medical power of attorney for the patient. Social worker consulted to assist with the case.   APS report has been filed. We will reconsult psychiatry for further assistance.  Hypokalemia: Repleted & resolved  COPD: Chronic and stable without active wheezing or hypoxia. Not requiring maintenance therapy as an outpatient. Continue monitor.  Hypertension: Currently normotensive. HoldingHCTZ as above, hold atenolol for now.  Type 2 diabetes: Controlled without any complication. On Metformin and Amaryl as an outpatient.  sensitive SSI while in hospital.  Tobacco use: Reports ongoing tobacco use and smoking 1 pack/day Currently tolerating without nicotine patch.  Constipation  Added stool softner.   Diet: Carb modified diet  DVT Prophylaxis: Subcutaneous Lovenox   Advance goals of care discussion: Full code  Family Communication: no family was present at  bedside, at the time of interview.  Unable to reach son on the phone.  Disposition:  Discharge to be determined .  Consultants:  Neurosurgery, psychiatry Procedures: none  Antibiotics: Anti-infectives (From admission, onward)   Start     Dose/Rate Route Frequency Ordered Stop   11/14/19 0900  ceFAZolin (ANCEF) IVPB 2g/100 mL premix     2 g 200 mL/hr over 30 Minutes Intravenous  Once 11/09/19 0912 11/14/19 1200       Objective: Physical Exam: Vitals:   11/13/19 1637 11/13/19 2016 11/14/19 0430 11/14/19 0838  BP: (!) 155/98 136/83 (!) 158/99 (!) 144/96  Pulse: 100 93 95 86  Resp:  20 20 18   Temp: 97.9 F (36.6 C) 98.2 F (36.8 C) 98.7 F (37.1 C) 97.7 F (36.5 C)  TempSrc: Oral Oral Oral Oral  SpO2: 93% 94% 94% 96%  Weight:      Height:        Intake/Output Summary (Last 24 hours) at 11/14/2019 1828 Last data filed at 11/14/2019 1300 Gross per 24 hour  Intake 120 ml  Output 750 ml  Net -630 ml   Filed Weights   11/06/19 1651 11/07/19 0530  Weight: 59 kg 70.3 kg   General: alert and oriented to place and person. Appear in mild distress, affect appropriate Eyes: PERRL, Conjunctiva normal ENT: Oral Mucosa Clear, moist  Neck: no JVD, no Abnormal Mass Or lumps Cardiovascular: S1 and S2 Present, no Murmur,  Respiratory: good respiratory effort, Bilateral Air entry equal and Decreased, no signs of accessory muscle use, Clear to Auscultation, no Crackles, no wheezes Abdomen: Bowel Sound present, Soft and no tenderness, no hernia Skin: no rashes  Extremities: no Pedal edema, no calf tenderness Neurologic: without any new focal findings Gait not checked due to patient safety concerns  Data Reviewed: I have personally reviewed and interpreted daily labs, tele strips, imagings as discussed above. I reviewed all nursing notes, pharmacy notes, vitals, pertinent old records I have discussed plan of care as described above with RN and patient/family.  CBC: Recent Labs  Lab 11/09/19 0504 11/11/19 0426 11/12/19 0431 11/13/19 0541 11/14/19 0520  WBC 6.1 5.3 6.5 7.0 6.6  HGB 12.1* 12.8* 12.8* 12.9*  12.4*  HCT 35.9* 37.6* 38.5* 37.7* 38.0*  MCV 87.6 87.6 89.3 86.5 90.0  PLT 158 183 214 199 212   Basic Metabolic Panel: Recent Labs  Lab 11/09/19 0504 11/11/19 0426 11/12/19 0431 11/13/19 0541 11/14/19 0520  NA 140 144 139 140 141  K 3.6 4.2 3.7 3.8 3.8  CL 105 103 101 101 104  CO2 25 31 27 27 26   GLUCOSE 151* 137* 138* 143* 160*  BUN 21 16 15 17 20   CREATININE 0.59* 0.59* 0.56* 0.53* 0.49*  CALCIUM 8.3* 8.6* 8.3* 8.6* 8.5*  MG  --  2.0  --   --   --     Liver Function Tests: No results for input(s): AST, ALT, ALKPHOS, BILITOT, PROT, ALBUMIN in the last 168 hours. No results for input(s): LIPASE, AMYLASE in the last 168 hours. No results for input(s): AMMONIA in the last 168 hours. Coagulation Profile: No results for input(s): INR, PROTIME in the last 168 hours. Cardiac Enzymes: No results for input(s): CKTOTAL, CKMB, CKMBINDEX, TROPONINI in the last 168 hours. BNP (last 3 results) No results for input(s): PROBNP in the last 8760 hours. CBG: Recent Labs  Lab 11/13/19 1156 11/13/19 1635 11/14/19 0841 11/14/19 1137 11/14/19 1727  GLUCAP 157* 141* 130* 165* 93   Studies:  No results found.   Time spent: 35 minutes  Author: Lynden Oxford, MD [facility] 11/14/2019 6:28 PM  To reach On-call, see care teams to locate the attending and reach out to them via [email or web address]. If 7PM-7AM, please contact night-coverage If you still have difficulty reaching the attending provider, please page the [facility] (Director on Call) for [facility] on amion for assistance.

## 2019-11-15 LAB — GLUCOSE, CAPILLARY
Glucose-Capillary: 150 mg/dL — ABNORMAL HIGH (ref 70–99)
Glucose-Capillary: 128 mg/dL — ABNORMAL HIGH (ref 70–99)
Glucose-Capillary: 182 mg/dL — ABNORMAL HIGH (ref 70–99)
Glucose-Capillary: 179 mg/dL — ABNORMAL HIGH (ref 70–99)
Glucose-Capillary: 142 mg/dL — ABNORMAL HIGH (ref 70–99)

## 2019-11-15 LAB — CBC
HCT: 42.2 % (ref 39.0–52.0)
Hemoglobin: 14.4 g/dL (ref 13.0–17.0)
MCH: 29.7 pg (ref 26.0–34.0)
MCHC: 34.1 g/dL (ref 30.0–36.0)
MCV: 87 fL (ref 80.0–100.0)
Platelets: 236 10*3/uL (ref 150–400)
RBC: 4.85 MIL/uL (ref 4.22–5.81)
RDW: 13.2 % (ref 11.5–15.5)
WBC: 6.5 10*3/uL (ref 4.0–10.5)
nRBC: 0 % (ref 0.0–0.2)

## 2019-11-15 LAB — BASIC METABOLIC PANEL
Anion gap: 12 (ref 5–15)
BUN: 18 mg/dL (ref 8–23)
CO2: 29 mmol/L (ref 22–32)
Calcium: 8.8 mg/dL — ABNORMAL LOW (ref 8.9–10.3)
Chloride: 102 mmol/L (ref 98–111)
Creatinine, Ser: 0.48 mg/dL — ABNORMAL LOW (ref 0.61–1.24)
GFR calc Af Amer: >60 mL/min (ref >60)
GFR calc non Af Amer: >60 mL/min (ref >60)
Glucose, Bld: 143 mg/dL — ABNORMAL HIGH (ref 70–99)
Potassium: 3.5 mmol/L (ref 3.5–5.1)
Sodium: 143 mmol/L (ref 135–145)

## 2019-11-15 MED ORDER — ATENOLOL 25 MG PO TABS
50.0000 mg | ORAL_TABLET | Freq: Every day | ORAL | Status: DC
  Administered 2019-11-15 – 2019-11-22 (×8): 50 mg via ORAL
  Filled 2019-11-15 (×10): qty 2

## 2019-11-15 MED ORDER — ADULT MULTIVITAMIN W/MINERALS CH
1.0000 | ORAL_TABLET | Freq: Every day | ORAL | Status: DC
  Administered 2019-11-15 – 2019-11-22 (×8): 1 via ORAL
  Filled 2019-11-15 (×8): qty 1

## 2019-11-15 NOTE — Progress Notes (Signed)
Neurosurgery-Progress Note 11/15/2019 RICKI CLACK [id number]  Identifying Statement: LASZLO ELLERBY is a 85 y.o. male from [place] [address] with lumbar fracture  Physician Requesting Consultation: Delfino Lovett, MD  History of Present Illness: Mr. Fedak was brought to the ED on 11/15 after being found down and with AMS> He was complaining of back and buttocks pain and a CT of the lumbar spine was obtained that showed a fracture through the L1/2 disc space and ankylosed spine adjacent to it. He has a PMH for DM and COPD and still not returned to baseline mental status. He denies any obvious leg pain. He does not endorse any numbness. He is not coherent enough to tell me of any events surrounding the fall.    Interval Events: Patient remains mildly altered and has been evaluated by psychiatry who felt he does not have the capacity make decisions.  I have spoken with his stepson who does endorse that he has not seen him in some time.  He did state that he would go along with whatever the doctors recommended that he could not give any determination on what his CODE STATUS should be or any treatments he would directly want.  Given this, we have discussed with the primary team need for medical guardianship for the patient.  When interacting with the patient today, he does state that he has some back and buttocks pain still but the leg pain is resolved.  He denies any obvious weakness.  He is in the TLSO brace.   Past Medical History:  Past Medical History:  Diagnosis Date  . COPD (chronic obstructive pulmonary disease) (HCC)   . Diabetes mellitus without complication (HCC)   . Hypertension   . Tobacco use     Social History: Social History   Socioeconomic History  . Marital status: Widowed    Spouse name: Not on file  . Number of children: Not on file  . Years of education: Not on file  . Highest education level: Not on file  Occupational History  . Not on file  Social Needs   . Financial resource strain: Not on file  . Food insecurity    Worry: Not on file    Inability: Not on file  . Transportation needs    Medical: Not on file    Non-medical: Not on file  Tobacco Use  . Smoking status: Current Every Day Smoker    Packs/day: 1.00  Substance and Sexual Activity  . Alcohol use: Not Currently  . Drug use: Never  . Sexual activity: Not on file  Lifestyle  . Physical activity    Days per week: Not on file    Minutes per session: Not on file  . Stress: Not on file  Relationships  . Social Musician on phone: Not on file    Gets together: Not on file    Attends religious service: Not on file    Active member of club or organization: Not on file    Attends meetings of clubs or organizations: Not on file    Relationship status: Not on file  . Intimate partner violence    Fear of current or ex partner: Not on file    Emotionally abused: Not on file    Physically abused: Not on file    Forced sexual activity: Not on file  Other Topics Concern  . Not on file  Social History Narrative  . Not on file    Family  History: Family History  Problem Relation Age of Onset  . Diabetes Mother   . Heart attack Mother   . Stroke Mother   . Diabetes Father   . Heart attack Father     Review of Systems:  Review of Systems - General ROS: Negative Psychological ROS: Positive for AMS Ophthalmic ROS: Negative ENT ROS: Negative Hematological and Lymphatic ROS: Negative  Endocrine ROS: Negative Respiratory ROS: Negative Cardiovascular ROS: Negative Gastrointestinal ROS: Negative Genito-Urinary ROS: Negative Musculoskeletal ROS: Positive for back pain Neurological ROS: Negative for leg numbness and weakness Dermatological ROS: Negative  Physical Exam: BP (!) 148/91 (BP Location: Left Arm)   Pulse 85   Temp (!) 97.5 F (36.4 C) (Oral)   Resp 18   Ht 5\' 10"  (1.778 m)   Wt 70.3 kg   SpO2 94%   BMI 22.24 kg/m  Body mass index is 22.24  kg/m. Body surface area is 1.86 meters squared. General appearance: Alert, not able to fully cooperate with exam Head: Normocephalic, atraumatic Eyes: Normal, EOM intact Back: Wearing TLSO brace Ext: No edema in LE bilaterally, notable ecchymosis in all extremities  Neurologic exam:  Mental status: alertness: alert,  affect: blunted Speech: fluent and clear Cranial nerves:  V/VII:no evidence of facial droop or weakness  VIII: hard of hearing Motor:strength 5/5 in hip flexion, dorsiflexion, and plantarflexion bilaterally but wont perform knee movements to test strength Sensory: intact to light touch in lower extremities Gait: not tested  Imaging: CT Lumbar Spine:  1. Osteopenia with lower thoracic through L3 ankylosis, and a degree of bilateral SI joint ankylosis. 2. Horizontal fracture through the L1-L2 disc space fracture now with mild distraction of the anterior disc space / mild kyphosis. But no spondylolisthesis, and the posterior elements at each level  appear to remain intact and normally aligned. 3. Associated small volume hematoma in the paraspinal soft tissues tracking inferiorly along the right psoas muscle. 4. No other acute osseous abnormality identified. 5. Degenerative multifactorial spinal and left neural foraminal stenosis at L4-L5. 6. Aortic Atherosclerosis (ICD10-I70.0). Large bowel diverticulosis.   Impression/Plan:  Mr. Vora is here with a likely fall and AMS resulting in a fracture at L1/2 that does not appear stable. He has some ALL disruption but no listhesis and neurologic exam is reassuring. I tried to discuss with him the possibility of surgery and he states he would want to avoid if possible but conservative therapy has a high rate of failure given his bone quality and adjacent ankylosed spine. However, surgery would likely consist of a T10- L3 fusion which does carry some risks. For now, I recommend a TLSO brace and spine precautions.   We will await final  determination of medical guardianship or decision-making capacity for the patient to determine next course of action.  Would recommend continued spine precautions but may elevate bed to 45 degrees.  Physical therapy can work with the patient in bed for leg strengthening and range of motion.  Would recommend continue to not weight-bear.   He is OK for a diet and DVT prophylaxis  1.  Diagnosis: L1/2 fracture  Plan:  - TLSO brace and spine precautions at this time. Possible surgery pending further discussions of capacity -Can elevate head of bed at 45 degrees with brace on -Physical therapy can work with patient in bed without weightbearing

## 2019-11-15 NOTE — Progress Notes (Signed)
Initial Nutrition Assessment  DOCUMENTATION CODES:   Not applicable  INTERVENTION:  -Continue Ensure Enlive po BID, each supplement provides 350 kcal and 20 grams of protein  -Hormel Vital Cuisine po daily, each supplement provides 520 kcal and 22 grams of protein  -Magic cup BID with lunch and dinner meals, each supplement provides 290 kcal and 9 grams of protein  -MVI with minerals daily  -Recommend consideration of appetite stimulant  -Recommend obtaining new weight, current weight recorded on admission   NUTRITION DIAGNOSIS:   Inadequate oral intake related to lethargy/confusion as evidenced by energy intake < or equal to 50% for > or equal to 5 days(per chart review, pt eating avg 15% of meals during admission; dehydration present on admission).   GOAL:   Patient will meet greater than or equal to 90% of their needs    MONITOR:   PO intake, Labs, I & O's, Supplement acceptance, Weight trends  REASON FOR ASSESSMENT:   Consult Assessment of nutrition requirement/status  ASSESSMENT:  RD working remotely.  85 year old male with medical history significant for T2DM, HTN, COPD who presented to ED from [facility] independent living facility s/p unwitnessed fall with AMS. Patient history limited due to encephalopathy, but he recalls that he was turning around in his kitchen when his left leg gave out on him resulting in mechanical fall.  Patient admitted with acute metabolic encephalopathy of unclear etiology.   Per chart review patient's confusion reportedly significant change from baseline. CT head without acute findings, does show chronic ischemic microvascular white matter disease. Patient evaluated by psychiatry who feels patient does not have capacity to make decisions.   CT of lumbar spine showed an L1-L2 disc space fracture. Neurosurgery consulted; awaiting final determination of medical guardianship before determining next course of action. Continued spine  precautions, TLSO brace for now.  Patient noted with very poor po intake; eating 0-60% of the last 8 recorded meals. Unable to obtain nutrition history at this time, RD working remotely and patient with [place]. Patient provided Ensure three times daily per medication review. RD will provide Hormel Vital Cuisine daily and Magic Cup with meals to aid with calorie/protein needs. Patient may benefit from an appetite stimulant.   No weight history for review. Last weight 70.3 kg taken on admission. Recommend obtaining new weight to fully assess current status.  Medications reviewed and include: Ensure, SSI, Miralax, Senokot Labs: CBGs 93-182 x 24 hrs  NUTRITION - FOCUSED PHYSICAL EXAM: Unable to complete at this time, RD working remotely.  Diet Order:   Diet Order            Diet regular Room service appropriate? Yes; Fluid consistency: Thin  Diet effective now              EDUCATION NEEDS:   No education needs have been identified at this time  Skin:  Skin Assessment: Reviewed RN Assessment  Last BM:  No BM charted since admission  Height:   Ht Readings from Last 1 Encounters:  11/06/19 5\' 10"  (1.778 m)    Weight:   Wt Readings from Last 1 Encounters:  11/07/19 70.3 kg    Ideal Body Weight:  75.5 kg  BMI:  Body mass index is 22.24 kg/m.  Estimated Nutritional Needs:   Kcal:  1800-2000  Protein:  90-100  Fluid:  >/= 1.7 L/day  Lajuan Lines, RD, LDN Clinical Nutrition [facility] Telephone [phone] After Hours/Weekend Pager: [phone]

## 2019-11-15 NOTE — Plan of Care (Signed)
PMT note: SW speaking with DSS regarding guardianship. Per notes, patient does not have decision making capacity and stepson does not want to make decisions for patient. Please re-consult if needed once a medical decision maker is in place or patient is oriented to make decisions.

## 2019-11-15 NOTE — Progress Notes (Addendum)
PROGRESS NOTE    Tony Flores  WUJ:[id number] DOB: 01-16-1930 DOA: 11/06/2019 PCP: Patient, No Pcp Per      Brief Narrative:  Tony Flores is a 85 y.o. M with DM, HTN, COPD not on O2/maintenance inhalers at home and with ongoing smoking who presented due to being found down, altered.  In the ER, CT of the lumbar spine showed an L1-L2 disc space fracture.  Appear dehydrated and confused.       Assessment & Plan:  Acute metabolic encephalopathy superimposed on likely mild cognitive impairment Agree the more pronounced encephalopathy at presentation was likely multifactorial superimposed on progression of some baseline cognitive impairment.  He now appears resolved back to his baseline, but unfortunately I agree with psychiatry, he is partially oriented (states it is November 2020 and "Tony Flores stole the election", but also states he is at [facility], and cannot tell me what his injury is), but is unable to articulate his injury still and is unable to comprehend the significance of the complications he may encounter with surgery and what this would mean for his daily life.  TSH normal.  Dehydration treated with IV fluids.  No infection present.  CT showed no acute findings, did show chronic ischemic microvascular change consistent with dementia.  I agree this is likely just progression of his baseline cognitive impairment. -TOC consult for guardianship   L1-L2 disc space fracture Frequent falls  Perusing patient's chart, he has had recurrent falls in the last year.  On admission here, CT of the lumbar spine showed an L1-L2 disc space fracture.  Neurosurgery were consulted who recommended nonweightbearing, HOB to 45 degrees only, PT for bed mobility, and surgical repair. -Nonweightbearing -Maintain TLSO brace -HOB to 45 degrees -PT for bed mobility -Consult neurosurgery, appreciate recommendations    ADDENDUM: I spoke with Dr. Adriana Simas from Neurosurgery.  This is a potentially  unstable fracture, and there is broad agreement that it should be fixed surgically. If legal proceedings delay surgery unnecessarily, he is at risk of neurologic compromise potentially requiring emergency surgery from something as simple as standing and walking (given the patient's cognitive impairments, poor insight into his condition and impulsiveness, this is a real risk).  Every effort should be made by the hospital and the county/DSS to secure a temporary decision maker for this patient to either proceed with expedited surgery or palliative care.  Dr. Patsey Berthold direct number is in the hospitalist communication tab.     Diabetes Glucoses normal -Hold Metformin and Amaryl -Sliding scale corrections ordered  Hypertension Blood pressure normal off meds -Hold HCTZ  -Resume atenolol  Smoking Unable to recommend cessation in this encephalopathic patient  COPD No active disease  Hypokalemia Resolved  Moderate protein calorie malnutrition As evidenced by weight loss, moderate diffuse loss of subcutaneous muscle mass and fat. -Continue Ensure -Consult dietitian      MDM and disposition: The below labs and imaging reports were reviewed and summarized above.  Medication management as above.  The patient was admitted with unstable lumbar spine fracture.    He was independent at baseline, but currently is nonweightbearing.  Definitive treatment of his lumbar fracture in order to regain the ability to walk, self-care, would require surgery with significant possible morbidity or even mortality.  As the patient is unable to appropriately comprehend decision-making regarding the surgery, an alternative decision maker is being sought        DVT prophylaxis: Lovenox Code Status: Full code Family Communication:     Consultants:  Neurosurgery  Palliative care  Psychiatry  Procedures:   11/15 CT head  11/16 CT lumbar spine  Antimicrobials:       Subjective:  Patient has a question about pain medicine, but he is unable to state what his question is or which pain medicine he is asking about.  He states that it is 2020 and discusses the election, but cannot remember where he is.  Has back pain.  No tingling or pain in the legs.  No headache, dyspnea, chest pain.  Objective: Vitals:   11/14/19 0430 11/14/19 0838 11/14/19 2335 11/15/19 0742  BP: (!) 158/99 (!) 144/96 (!) 153/83 (!) 148/91  Pulse: 95 86 86 85  Resp: 20 18 16 18   Temp: 98.7 F (37.1 C) 97.7 F (36.5 C) 97.6 F (36.4 C) (!) 97.5 F (36.4 C)  TempSrc: Oral Oral Oral Oral  SpO2: 94% 96% 91% 94%  Weight:      Height:        Intake/Output Summary (Last 24 hours) at 11/15/2019 0851 Last data filed at 11/15/2019 0500 Gross per 24 hour  Intake 120 ml  Output 150 ml  Net -30 ml   Filed Weights   11/06/19 1651 11/07/19 0530  Weight: 59 kg 70.3 kg    Examination: General appearance: Elderly adult male, alert and in no acute distress.  Interactive, lying flat in bed. HEENT: Anicteric, conjunctiva pink, lids and lashes normal. No nasal deformity, discharge, epistaxis.  Lips moist, partially edentulous, oropharynx tacky dry, no oral lesions, hearing normal.   Skin: Warm and dry.   No suspicious rashes or lesions. Cardiac: RRR, nl S1-S2, no murmurs appreciated.  Capillary refill is brisk.  JVP not visible.  No LE edema.  Radial pulses 2+ and symmetric. Respiratory: Normal respiratory rate and rhythm.  CTAB without rales or wheezes. Abdomen: Abdomen soft.  Scaphoid.  No TTP or guarding. No ascites, distension, hepatosplenomegaly.   MSK: No deformities or effusions.  Diffuse moderate loss of subcutaneous muscle mass and fat. Neuro: Awake and alert.  EOMI, moves upper extremities with normal strength and coordination.  Leg strength not testing, but able to move feet/ankles without difficulty, also no paresthesias in the legs. Speech fluent.    Psych: Sensorium intact and responding to  questions, attention normal. Affect blunted.  Judgment and insight appear impaired.  Oriented to November, 2020, and election results.  Unable to tell me where he has, ultimately states he is home at [facility].  Unable to tell me why he is in the hospital.    Data Reviewed: I have personally reviewed following labs and imaging studies:  CBC: Recent Labs  Lab 11/11/19 0426 11/12/19 0431 11/13/19 0541 11/14/19 0520 11/15/19 0511  WBC 5.3 6.5 7.0 6.6 6.5  HGB 12.8* 12.8* 12.9* 12.4* 14.4  HCT 37.6* 38.5* 37.7* 38.0* 42.2  MCV 87.6 89.3 86.5 90.0 87.0  PLT 183 214 199 212 236   Basic Metabolic Panel: Recent Labs  Lab 11/11/19 0426 11/12/19 0431 11/13/19 0541 11/14/19 0520 11/15/19 0511  NA 144 139 140 141 143  K 4.2 3.7 3.8 3.8 3.5  CL 103 101 101 104 102  CO2 31 27 27 26 29   GLUCOSE 137* 138* 143* 160* 143*  BUN 16 15 17 20 18   CREATININE 0.59* 0.56* 0.53* 0.49* 0.48*  CALCIUM 8.6* 8.3* 8.6* 8.5* 8.8*  MG 2.0  --   --   --   --    GFR: Estimated Creatinine Clearance: 65.9 mL/min (A) (by C-G  formula based on SCr of 0.48 mg/dL (L)). Liver Function Tests: No results for input(s): AST, ALT, ALKPHOS, BILITOT, PROT, ALBUMIN in the last 168 hours. No results for input(s): LIPASE, AMYLASE in the last 168 hours. No results for input(s): AMMONIA in the last 168 hours. Coagulation Profile: No results for input(s): INR, PROTIME in the last 168 hours. Cardiac Enzymes: No results for input(s): CKTOTAL, CKMB, CKMBINDEX, TROPONINI in the last 168 hours. BNP (last 3 results) No results for input(s): PROBNP in the last 8760 hours. HbA1C: No results for input(s): HGBA1C in the last 72 hours. CBG: Recent Labs  Lab 11/14/19 0841 11/14/19 1137 11/14/19 1727 11/14/19 2244 11/15/19 0740  GLUCAP 130* 165* 93 154* 128*   Lipid Profile: No results for input(s): CHOL, HDL, LDLCALC, TRIG, CHOLHDL, LDLDIRECT in the last 72 hours. Thyroid Function Tests: No results for input(s): TSH,  T4TOTAL, FREET4, T3FREE, THYROIDAB in the last 72 hours. Anemia Panel: No results for input(s): VITAMINB12, FOLATE, FERRITIN, TIBC, IRON, RETICCTPCT in the last 72 hours. Urine analysis:    Component Value Date/Time   COLORURINE YELLOW (A) 11/08/2019 0744   APPEARANCEUR CLEAR (A) 11/08/2019 0744   LABSPEC 1.018 11/08/2019 0744   PHURINE 5.0 11/08/2019 0744   GLUCOSEU NEGATIVE 11/08/2019 0744   HGBUR NEGATIVE 11/08/2019 0744   BILIRUBINUR NEGATIVE 11/08/2019 0744   KETONESUR 5 (A) 11/08/2019 0744   PROTEINUR NEGATIVE 11/08/2019 0744   NITRITE NEGATIVE 11/08/2019 0744   LEUKOCYTESUR NEGATIVE 11/08/2019 0744   Sepsis Labs: @LABRCNTIP (procalcitonin:4,lacticacidven:4)  ) Recent Results (from the past 240 hour(s))  SARS CORONAVIRUS 2 (TAT 6-24 HRS) Nasopharyngeal Nasopharyngeal Swab     Status: None   Collection Time: 11/06/19 10:29 PM   Specimen: Nasopharyngeal Swab  Result Value Ref Range Status   SARS Coronavirus 2 NEGATIVE NEGATIVE Final    Comment: (NOTE) SARS-CoV-2 target nucleic acids are NOT DETECTED. The SARS-CoV-2 RNA is generally detectable in upper and lower respiratory specimens during the acute phase of infection. Negative results do not preclude SARS-CoV-2 infection, do not rule out co-infections with other pathogens, and should not be used as the sole basis for treatment or other patient management decisions. Negative results must be combined with clinical observations, patient history, and epidemiological information. The expected result is Negative. Fact Sheet for Patients: SugarRoll.be Fact Sheet for Healthcare Providers: https://www.woods-mathews.com/ This test is not yet approved or cleared by the [place] FDA and  has been authorized for detection and/or diagnosis of SARS-CoV-2 by FDA under an Emergency Use Authorization (EUA). This EUA will remain  in effect (meaning this test can be used) for the duration of  the COVID-19 declaration under Section 56 4(b)(1) of the Act, 21 U.S.C. section 360bbb-3(b)(1), unless the authorization is terminated or revoked sooner. Performed at [facility], [place] [address], [place], [place] [address]   Urine culture     Status: Abnormal   Collection Time: 11/08/19  7:44 AM   Specimen: Urine, Random  Result Value Ref Range Status   Specimen Description   Final    URINE, RANDOM Performed at [facility], [address], [place], [place] [address]    Special Requests   Final    NONE Performed at [facility], [address] [place], [place] [address]    Culture MULTIPLE SPECIES PRESENT, SUGGEST RECOLLECTION (A)  Final   Report Status 11/09/2019 FINAL  Final         Radiology Studies: No results found.      Scheduled  Meds: . enoxaparin (LOVENOX) injection  40 mg Subcutaneous Q24H  . feeding supplement (ENSURE ENLIVE)  237 mL Oral TID BM  . influenza vaccine adjuvanted  0.5 mL Intramuscular Tomorrow-1000  . insulin aspart  0-9 Units Subcutaneous TID WC  . ketotifen  1 drop Left Eye BID  . pneumococcal 23 valent vaccine  0.5 mL Intramuscular Tomorrow-1000  . polyethylene glycol  17 g Oral Daily  . senna-docusate  1 tablet Oral BID   Continuous Infusions:   LOS: 8 days    Time spent: 25 minutes    Alberteen Sam, MD [facility] 11/15/2019, 8:51 AM     Please page though AMION or Epic secure chat:  For [employer], Higher education careers adviser

## 2019-11-16 DIAGNOSIS — F0390 Unspecified dementia without behavioral disturbance: Secondary | ICD-10-CM

## 2019-11-16 DIAGNOSIS — R7301 Impaired fasting glucose: Secondary | ICD-10-CM

## 2019-11-16 DIAGNOSIS — I10 Essential (primary) hypertension: Secondary | ICD-10-CM

## 2019-11-16 DIAGNOSIS — S32019D Unspecified fracture of first lumbar vertebra, subsequent encounter for fracture with routine healing: Secondary | ICD-10-CM

## 2019-11-16 LAB — GLUCOSE, CAPILLARY
Glucose-Capillary: 132 mg/dL — ABNORMAL HIGH (ref 70–99)
Glucose-Capillary: 148 mg/dL — ABNORMAL HIGH (ref 70–99)
Glucose-Capillary: 120 mg/dL — ABNORMAL HIGH (ref 70–99)
Glucose-Capillary: 134 mg/dL — ABNORMAL HIGH (ref 70–99)

## 2019-11-16 MED ORDER — HYDROCODONE-ACETAMINOPHEN 5-325 MG PO TABS
1.0000 | ORAL_TABLET | Freq: Four times a day (QID) | ORAL | Status: DC | PRN
  Administered 2019-11-17 – 2019-11-22 (×6): 1 via ORAL
  Filled 2019-11-16 (×6): qty 1

## 2019-11-16 NOTE — Care Management Important Message (Signed)
Important Message  Patient Details  Name: Tony Flores MRN: [id number] Date of Birth: 1930/08/04   Medicare Important Message Given:  Other (see comment)  RN CM reaching to patient's brother for decision making.    Tony Flores 11/16/2019, 11:04 AM

## 2019-11-16 NOTE — TOC Progression Note (Signed)
Transition of Care [facility]) - Progression Note    Patient Details  Name: Tony Flores MRN: [id number] Date of Birth: 1930/02/04  Transition of Care [facility]) CM/SW Contact  Su Hilt, RN Phone Number: 11/16/2019, 8:42 AM  Clinical Narrative:     I called the financial office and spoke to [facility] at [phone].  I requested for her to apply for long term medicaid.  She stated that due to the patient being unable to make decisions they would not be able to apply for anything because the patient cant give consent.       Expected Discharge Plan and Services                                                 Social Determinants of Health (SDOH) Interventions    Readmission Risk Interventions No flowsheet data found.

## 2019-11-16 NOTE — TOC Progression Note (Signed)
Transition of Care [facility]) - Progression Note    Patient Details  Name: Tony Flores MRN: [id number] Date of Birth: [birth year]  Transition of Care [facility]) CM/SW Contact  Su Hilt, RN Phone Number: 11/16/2019, 2:45 PM  Clinical Narrative:     [name] the patients brother called back.  He said he does not want to take on any financial responsibility.  I explained that he would not be financially responsible.  He said that he does not know if he will be living very long. He agrees to get a phone call and approve the surgery.  He said he will agree to talk to the doctors to give consent for medical things They may need to leave a message and he will call them back if they leave a phone number       Expected Discharge Plan and Services                                                 Social Determinants of Health (SDOH) Interventions    Readmission Risk Interventions No flowsheet data found.

## 2019-11-16 NOTE — Progress Notes (Signed)
Patient ID: Tony Flores, male   DOB: 16-Sep-1930, 85 y.o.   MRN: [id number] [facility] PROGRESS NOTE  Tony Flores XNA:[id number] DOB: May 17, 1930 DOA: 11/06/2019 PCP: Patient, No Pcp Per  HPI/Subjective: Patient is not the best historian.  He states that he has been choking on liquids.  Only slight back pain.  Objective: Vitals:   11/16/19 0015 11/16/19 0813  BP: 132/79 122/70  Pulse: 66 63  Resp:    Temp: (!) 97.5 F (36.4 C) 97.8 F (36.6 C)  SpO2: 92% 97%    Intake/Output Summary (Last 24 hours) at 11/16/2019 1231 Last data filed at 11/16/2019 0900 Gross per 24 hour  Intake -  Output 500 ml  Net -500 ml   Filed Weights   11/06/19 1651 11/07/19 0530  Weight: 59 kg 70.3 kg    ROS: Review of Systems  Unable to perform ROS: Dementia  Respiratory: Negative for shortness of breath.   Cardiovascular: Negative for chest pain.  Gastrointestinal: Negative for abdominal pain.  Musculoskeletal: Positive for back pain.   Exam: Physical Exam  HENT:  Nose: No mucosal edema.  Mouth/Throat: No oropharyngeal exudate or posterior oropharyngeal edema.  Eyes: Pupils are equal, round, and reactive to light. Conjunctivae, EOM and lids are normal.  Neck: No JVD present. Carotid bruit is not present. No edema present. No thyroid mass and no thyromegaly present.  Cardiovascular: S1 normal and S2 normal. Exam reveals no gallop.  No murmur heard. Pulses:      Dorsalis pedis pulses are 2+ on the right side and 2+ on the left side.  Respiratory: No respiratory distress. He has no wheezes. He has no rhonchi. He has no rales.  GI: Soft. Bowel sounds are normal. There is no abdominal tenderness.  Musculoskeletal:     Right shoulder: He exhibits no swelling.  Lymphadenopathy:    He has no cervical adenopathy.  Neurological: He is alert. No cranial nerve deficit.  Skin: Skin is warm. No rash noted. Nails show no clubbing.  Psychiatric: He has a normal mood and affect.       Data Reviewed: Basic Metabolic Panel: Recent Labs  Lab 11/11/19 0426 11/12/19 0431 11/13/19 0541 11/14/19 0520 11/15/19 0511  NA 144 139 140 141 143  K 4.2 3.7 3.8 3.8 3.5  CL 103 101 101 104 102  CO2 31 27 27 26 29   GLUCOSE 137* 138* 143* 160* 143*  BUN 16 15 17 20 18   CREATININE 0.59* 0.56* 0.53* 0.49* 0.48*  CALCIUM 8.6* 8.3* 8.6* 8.5* 8.8*  MG 2.0  --   --   --   --    CBC: Recent Labs  Lab 11/11/19 0426 11/12/19 0431 11/13/19 0541 11/14/19 0520 11/15/19 0511  WBC 5.3 6.5 7.0 6.6 6.5  HGB 12.8* 12.8* 12.9* 12.4* 14.4  HCT 37.6* 38.5* 37.7* 38.0* 42.2  MCV 87.6 89.3 86.5 90.0 87.0  PLT 183 214 199 212 236    CBG: Recent Labs  Lab 11/15/19 1216 11/15/19 1708 11/15/19 2122 11/16/19 0813 11/16/19 1207  GLUCAP 182* 179* 142* 132* 148*    Recent Results (from the past 240 hour(s))  SARS CORONAVIRUS 2 (TAT 6-24 HRS) Nasopharyngeal Nasopharyngeal Swab     Status: None   Collection Time: 11/06/19 10:29 PM   Specimen: Nasopharyngeal Swab  Result Value Ref Range Status   SARS Coronavirus 2 NEGATIVE NEGATIVE Final    Comment: (NOTE) SARS-CoV-2 target nucleic acids are NOT DETECTED. The SARS-CoV-2 RNA is generally detectable in upper and  lower respiratory specimens during the acute phase of infection. Negative results do not preclude SARS-CoV-2 infection, do not rule out co-infections with other pathogens, and should not be used as the sole basis for treatment or other patient management decisions. Negative results must be combined with clinical observations, patient history, and epidemiological information. The expected result is Negative. Fact Sheet for Patients: HairSlick.no Fact Sheet for Healthcare Providers: quierodirigir.com This test is not yet approved or cleared by the [place] FDA and  has been authorized for detection and/or diagnosis of SARS-CoV-2 by FDA under an Emergency Use  Authorization (EUA). This EUA will remain  in effect (meaning this test can be used) for the duration of the COVID-19 declaration under Section 56 4(b)(1) of the Act, 21 U.S.C. section 360bbb-3(b)(1), unless the authorization is terminated or revoked sooner. Performed at [facility], [address] [place], [place] [address]   Urine culture     Status: Abnormal   Collection Time: 11/08/19  7:44 AM   Specimen: Urine, Random  Result Value Ref Range Status   Specimen Description   Final    URINE, RANDOM Performed at [facility], [address] [place], [place] [address]    Special Requests   Final    NONE Performed at [facility], [address] [place], [place] [address]    Culture MULTIPLE SPECIES PRESENT, SUGGEST RECOLLECTION (A)  Final   Report Status 11/09/2019 FINAL  Final      Scheduled Meds: . atenolol  50 mg Oral Daily  . enoxaparin (LOVENOX) injection  40 mg Subcutaneous Q24H  . feeding supplement (ENSURE ENLIVE)  237 mL Oral TID BM  . influenza vaccine adjuvanted  0.5 mL Intramuscular Tomorrow-1000  . insulin aspart  0-9 Units Subcutaneous TID WC  . ketotifen  1 drop Left Eye BID  . multivitamin with minerals  1 tablet Oral Daily  . polyethylene glycol  17 g Oral Daily  . senna-docusate  1 tablet Oral BID   Assessment/Plan:  1. Acute metabolic encephalopathy with underlying dementia.  Patient was seen by psychiatry and they deemed him incompetent to make medical decisions at this time. 2. L1-L2 fracture.  Seen by neurosurgery and they wish to proceed with a procedure but unable to get consent from patient.  Care manager looking into who can consent for him. 3. Choking on liquids.  Will get swallow evaluation. 4. Essential hypertension on atenolol 5. Impaired fasting glucose.  Hemoglobin A1c 5.7.  Patient not a diabetic.  Get rid of sliding scale.  Code Status:     Code Status Orders  (From admission, onward)         Start      Ordered   11/06/19 2318  Full code  Continuous     11/06/19 2319        Code Status History    This patient has a current code status but no historical code status.   Advance Care Planning Activity     Disposition Plan: To be determined  Consultants:  Neurosurgery  Time spent: 25 minutes  [employer]

## 2019-11-16 NOTE — TOC Progression Note (Signed)
Transition of Care [facility]) - Progression Note    Patient Details  Name: [name] MRN: [id number] Date of Birth: August 22, 1930  [facility]) CM/SW Contact  Su Hilt, RN Phone Number: 11/16/2019, 2:32 PM  Clinical Narrative:     Spoke with Tony Flores the patients brother and he stated that he has a lot of health problems himself and that he is 85 years old, I explained that he is the only living relative.  He asked why the step children can't take over making decisions, I explained that legally he is the next of kin due to their mother being deceased and that they are not bilogical children. He stated that he doesn't know about agreeing to things medically.  He stated that he has not had contact in a long time.  He will think about it and call me back. I        Expected Discharge Plan and Services                                                 Social Determinants of Health (SDOH) Interventions    Readmission Risk Interventions No flowsheet data found.

## 2019-11-16 NOTE — TOC Progression Note (Signed)
Transition of Care [facility]) - Progression Note    Patient Details  Name: Tony Flores MRN: [id number] Date of Birth: 07/11/1930  Transition of Care [facility]) CM/SW Contact  Su Hilt, RN Phone Number: 11/16/2019, 8:37 AM  Clinical Narrative:    I called and left a VM asking for a return call from Phelan Schadt the patients brother [phone]        Expected Discharge Plan and Services                                                 Social Determinants of Health (SDOH) Interventions    Readmission Risk Interventions No flowsheet data found.

## 2019-11-16 NOTE — Evaluation (Signed)
Clinical/Bedside Swallow Evaluation Patient Details  Name: Tony Flores MRN: [id number] Date of Birth: 09/16/1930  Today's Date: 11/16/2019 Time: SLP Start Time (ACUTE ONLY): 1233 SLP Stop Time (ACUTE ONLY): 1325 SLP Time Calculation (min) (ACUTE ONLY): 52 min  Past Medical History:  Past Medical History:  Diagnosis Date  . COPD (chronic obstructive pulmonary disease) (HCC)   . Diabetes mellitus without complication (HCC)   . Hypertension   . Tobacco use    Past Surgical History: History reviewed. No pertinent surgical history. HPI:  Pt is a 85 y.o. male with medical history significant for type 2 diabetes, hypertension, COPD, and tobacco use who [facility]  He had a reported unwitnessed fall and altered mental status. Patient states today he was in his kitchen when he turned around and felt as if his left leg gave out on him and he fell.  He reports having pain in his lower back after the fall.  He reports a chronic cough which he attributes to his chronic tobacco use of 1 pack/day. Neurosurgery/MD report pt remains mildly altered in his mental status and has been evaluated by psychiatry who felt he does not have the capacity make decisions. CT of the lumbar spine was obtained that showed a fracture through the L1/2 disc space and ankylosed spine adjacent to it. Surgery has been discussed but current recommendation is for conservative management w/ brace d/t surgery risks for pt secondary to high rate of failure given his bone quality and adjacent ankylosed spine. SW speaking with DSS regarding guardianship. Palliative Care has been consulted for GOC. Dietician following. CXR revealed: "Mild scarring or atelectasis at the bases". No other Imaging history.   Assessment / Plan / Recommendation Clinical Impression  This was a limited evaluation d/t pt declining most po's w/ SLP. He stated "back pain" so NSG was informed to address. Pt was  agreeable to the few po's given but much time was needed to position pt comfortably in the bed w/ support of pillows low behind the back, head. Pt has much back discomfort d/t the recent fracture at L1/2 - see MD notes. Because of the positioning challenges and pain, pt does not sit fully upright - head can be supported more forward w/ towel roll. This challenge to achieving upright positioning can increase risk for choking/aspiration w/ any oral intake. After fully supporting pt, he accepted few po trials w/ SLP. Noted delayed coughing w/ thin liquids; none w/ puree and nectar consistency liquids. He declined trials of solid foods but noted many Midding Dentition which would impact mastiction. [facility] and puree [facility]. No decline in respiratory status noted during/post po intake; vocal quality clear after trials during brief talking about his eating/drinking lately. Pt did not appear fully focused during discussion and education w/ general aspiration precautions. Suspect increased risk for aspiration at this time d/t declined mental status as well as back pain/fx impacting his ability to achieve full, upright positioning for oral intake. Pt required feeding support as well - he was able to hold the Cup to drink which is recommended at all times. No unlateral oral weakness noted during bolus management; speech clear. Recommend a modified diet d/t missing Dentition and concern for pharyngeal phase dysphagia. Recommend a dysphagia level 2 (MINCED foods w/ gravy) and Nectar consistency liquids. Aspiration precautions; Pills in Puree for safer swallowing; Feeding Support and monitoring at meals. ST services will continue to follow.  SLP Visit Diagnosis: Dysphagia, oropharyngeal phase (R13.12)    Aspiration Risk  Mild aspiration risk;Moderate aspiration risk;Risk for inadequate nutrition/hydration    Diet Recommendation  Dysphagia level 2 (MINCED foods w/ gravies) and Nectar  consistency liquids; general aspiration precautions. Supportive positioning as upright as able w/ towel roll behind head to support head forward w/ any oral intake; Feeding Assistance.  Medication Administration: Whole meds with puree(for safer swallowing)    Other  Recommendations Recommended Consults: (Dietician and Palliative following) Oral Care Recommendations: Oral care BID;Staff/trained caregiver to provide oral care Other Recommendations: Order thickener from pharmacy;Prohibited food (jello, ice cream, thin soups);Remove water pitcher;Have oral suction available   Follow up Recommendations Skilled Nursing facility(TBD)      Frequency and Duration min 3x week  2 weeks       Prognosis Prognosis for Safe Diet Advancement: Fair Barriers to Reach Goals: Cognitive deficits(challenged positioning d/t back fx)      Swallow Study   General Date of Onset: 11/06/19 HPI: Pt is a 85 y.o. male with medical history significant for type 2 diabetes, hypertension, COPD, and tobacco use who presents to the ED from [facility] independent living facility.  He had a reported unwitnessed fall and altered mental status. Patient states today he was in his kitchen when he turned around and felt as if his left leg gave out on him and he fell.  He reports having pain in his lower back after the fall.  He reports a chronic cough which he attributes to his chronic tobacco use of 1 pack/day. Neurosurgery/MD report pt remains mildly altered in his mental status and has been evaluated by psychiatry who felt he does not have the capacity make decisions. CT of the lumbar spine was obtained that showed a fracture through the L1/2 disc space and ankylosed spine adjacent to it. Surgery has been discussed but current recommendation is for conservative management w/ brace d/t surgery risks for pt secondary to high rate of failure given his bone quality and adjacent ankylosed spine. SW speaking with DSS regarding  guardianship. Palliative Care has been consulted for GOC. Dietician following. CXR revealed: "Mild scarring or atelectasis at the bases". No other Imaging history. Type of Study: Bedside Swallow Evaluation Previous Swallow Assessment: none reported Diet Prior to this Study: Regular;Thin liquids Temperature Spikes Noted: No(wbc 6.5) Respiratory Status: Nasal cannula(3L) History of Recent Intubation: No Behavior/Cognition: Alert;Cooperative;Pleasant mood;Confused;Distractible;Requires cueing Oral Cavity Assessment: Within Functional Limits(grossly) Oral Care Completed by SLP: Recent completion by staff Oral Cavity - Dentition: Poor condition;Missing dentition Vision: Functional for self-feeding Self-Feeding Abilities: Able to feed self;Needs assist;Needs set up(held cup to drink) Patient Positioning: Postural control adequate for testing(supported pt forward w/ pillows - one behind head also) Baseline Vocal Quality: Normal Volitional Cough: Cognitively unable to elicit Volitional Swallow: Unable to elicit    Oral/Motor/Sensory Function Overall Oral Motor/Sensory Function: Within functional limits(no unilateral weakness noted; speech clear)   Ice Chips Ice chips: Within functional limits Presentation: Spoon(fed; 2 trials) Other Comments: did not want further   Thin Liquid Thin Liquid: Impaired Presentation: Self Fed;Straw(2 trial sips) Oral Phase Impairments: (none) Oral Phase Functional Implications: (none) Pharyngeal  Phase Impairments: Cough - Delayed(x2/2)    Nectar Thick Nectar Thick Liquid: Within functional limits Presentation: Self Fed;Straw(2 trial sips) Other Comments: pt declined further   Honey Thick Honey Thick Liquid: Not tested   Puree Puree: Within functional limits Presentation: Spoon(fed; 2 trials) Other Comments: pt declined further   Solid     Solid:  Not tested Other Comments: pt declined stating back pain -- NSG made aware       Orinda Kenner, MS,  CCC-SLP Erynne Kealey 11/16/2019,2:11 PM

## 2019-11-16 NOTE — TOC Progression Note (Signed)
Transition of Care [facility]) - Progression Note    Patient Details  Name: Tony Flores MRN: [id number] Date of Birth: June 27, 1930  Transition of Care [facility]) CM/SW Contact  Su Hilt, RN Phone Number: 11/16/2019, 3:34 PM  Clinical Narrative:    Spoke to Dr. Lacinda Axon and provided the Brother [name]'s number [phone] I had a lengthy discussion with the brother [name] about consenting and what it means for needed medical care.         Expected Discharge Plan and Services                                                 Social Determinants of Health (SDOH) Interventions    Readmission Risk Interventions No flowsheet data found.

## 2019-11-17 LAB — GLUCOSE, CAPILLARY
Glucose-Capillary: 119 mg/dL — ABNORMAL HIGH (ref 70–99)
Glucose-Capillary: 166 mg/dL — ABNORMAL HIGH (ref 70–99)
Glucose-Capillary: 116 mg/dL — ABNORMAL HIGH (ref 70–99)
Glucose-Capillary: 224 mg/dL — ABNORMAL HIGH (ref 70–99)

## 2019-11-17 NOTE — Progress Notes (Signed)
Patient ID: Tony Flores, male   DOB: 1930-04-12, 85 y.o.   MRN: [id number] [facility] PROGRESS NOTE  Tony Flores [id number]:[id number] DOB: 09/17/1930 DOA: 11/06/2019 PCP: Patient, No Pcp Per  HPI/Subjective: Patient stated he wanted to get out of here.  I asked him where he wanted to go and he said [facility].  The patient complains of some back pain.  As per the nursing staff he takes off his back brace.  Patient not eating much.  No complaints of abdominal pain.  Objective: Vitals:   11/17/19 0800 11/17/19 0926  BP: (!) 172/92 (!) 157/95  Pulse: 74 72  Resp:    Temp: (!) 97.4 F (36.3 C)   SpO2: 95%     Intake/Output Summary (Last 24 hours) at 11/17/2019 1138 Last data filed at 11/17/2019 1023 Gross per 24 hour  Intake 45 ml  Output 550 ml  Net -505 ml   Filed Weights   11/06/19 1651 11/07/19 0530  Weight: 59 kg 70.3 kg    ROS: Review of Systems  Unable to perform ROS: Dementia  Respiratory: Negative for shortness of breath.   Cardiovascular: Negative for chest pain.  Gastrointestinal: Negative for abdominal pain.  Musculoskeletal: Positive for back pain.   Exam: Physical Exam  HENT:  Nose: No mucosal edema.  Mouth/Throat: No oropharyngeal exudate or posterior oropharyngeal edema.  Eyes: Pupils are equal, round, and reactive to light. Conjunctivae, EOM and lids are normal.  Neck: No JVD present. Carotid bruit is not present. No edema present. No thyroid mass and no thyromegaly present.  Cardiovascular: S1 normal and S2 normal. Exam reveals no gallop.  No murmur heard. Pulses:      Dorsalis pedis pulses are 2+ on the right side and 2+ on the left side.  Respiratory: No respiratory distress. He has no wheezes. He has no rhonchi. He has no rales.  GI: Soft. Bowel sounds are normal. There is no abdominal tenderness.  Musculoskeletal:     Right ankle: He exhibits no swelling.     Left ankle: He exhibits no swelling.  Lymphadenopathy:    He has no cervical  adenopathy.  Neurological: He is alert. No cranial nerve deficit.  Patient was able to straight leg raise barely off the bed.  Skin: Skin is warm. No rash noted. Nails show no clubbing.  Psychiatric: He has a normal mood and affect.      Data Reviewed: Basic Metabolic Panel: Recent Labs  Lab 11/11/19 0426 11/12/19 0431 11/13/19 0541 11/14/19 0520 11/15/19 0511  NA 144 139 140 141 143  K 4.2 3.7 3.8 3.8 3.5  CL 103 101 101 104 102  CO2 31 27 27 26 29   GLUCOSE 137* 138* 143* 160* 143*  BUN 16 15 17 20 18   CREATININE 0.59* 0.56* 0.53* 0.49* 0.48*  CALCIUM 8.6* 8.3* 8.6* 8.5* 8.8*  MG 2.0  --   --   --   --    CBC: Recent Labs  Lab 11/11/19 0426 11/12/19 0431 11/13/19 0541 11/14/19 0520 11/15/19 0511  WBC 5.3 6.5 7.0 6.6 6.5  HGB 12.8* 12.8* 12.9* 12.4* 14.4  HCT 37.6* 38.5* 37.7* 38.0* 42.2  MCV 87.6 89.3 86.5 90.0 87.0  PLT 183 214 199 212 236    CBG: Recent Labs  Lab 11/16/19 1207 11/16/19 1702 11/16/19 2115 11/17/19 0802 11/17/19 1134  GLUCAP 148* 120* 134* 119* 166*    Recent Results (from the past 240 hour(s))  Urine culture     Status:  Abnormal   Collection Time: 11/08/19  7:44 AM   Specimen: Urine, Random  Result Value Ref Range Status   Specimen Description   Final    URINE, RANDOM Performed at [facility], [address] [place], [place] [address]    Special Requests   Final    NONE Performed at [facility], [address] [place], [place] [address]    Culture MULTIPLE SPECIES PRESENT, SUGGEST RECOLLECTION (A)  Final   Report Status 11/09/2019 FINAL  Final      Scheduled Meds: . atenolol  50 mg Oral Daily  . enoxaparin (LOVENOX) injection  40 mg Subcutaneous Q24H  . influenza vaccine adjuvanted  0.5 mL Intramuscular Tomorrow-1000  . ketotifen  1 drop Left Eye BID  . multivitamin with minerals  1 tablet Oral Daily  . polyethylene glycol  17 g Oral Daily  . senna-docusate  1 tablet Oral BID    Assessment/Plan:  1. Acute metabolic encephalopathy with underlying dementia.  Patient was seen by psychiatry and they deemed him incompetent to make medical decisions at this time.  Tried to reach the patient's brother Tony Flores at [phone] 2. L1-L2 fracture.  Seen by neurosurgery and they wish to proceed with a procedure but unable to get consent from patient.  Care manager gave Dr. Adriana Simas the number of the patient's brother Tony Flores. 3. Choking on liquids.  Patient placed on dysphagia 2 with nectar thick liquids by speech therapy. 4. Essential hypertension on atenolol 5. Impaired fasting glucose.  Hemoglobin A1c 5.7.  Patient not a diabetic.  Code Status:     Code Status Orders  (From admission, onward)         Start     Ordered   11/06/19 2318  Full code  Continuous     11/06/19 2319        Code Status History    This patient has a current code status but no historical code status.   Advance Care Planning Activity     Disposition Plan: Unclear at this point  Consultants:  Neurosurgery  Time spent: 26 minutes  [employer]

## 2019-11-18 LAB — GLUCOSE, CAPILLARY
Glucose-Capillary: 158 mg/dL — ABNORMAL HIGH (ref 70–99)
Glucose-Capillary: 168 mg/dL — ABNORMAL HIGH (ref 70–99)
Glucose-Capillary: 148 mg/dL — ABNORMAL HIGH (ref 70–99)

## 2019-11-18 MED ORDER — MIRTAZAPINE 15 MG PO TABS
15.0000 mg | ORAL_TABLET | Freq: Every day | ORAL | Status: DC
  Administered 2019-11-18 – 2019-11-24 (×7): 15 mg via ORAL
  Filled 2019-11-18 (×7): qty 1

## 2019-11-18 NOTE — Care Management Important Message (Signed)
Important Message  Patient Details  Name: Tony Flores MRN: [id number] Date of Birth: [birth year]   Medicare Important Message Given:  Other (see comment)  Patient unable to sign due to medical condition.  Left message with Wayland Salinas, at [phone] to review Medicare IM.    Dannette Barbara 11/18/2019, 3:12 PM

## 2019-11-18 NOTE — Progress Notes (Signed)
SLP Note  Patient Details Name: Tony Flores MRN: [id number] DOB: [birth year]   Cancelled treatment:       Reason Eval/Treat Not Completed: (chart reviewed; consulted NSG re: pt's status) Met w/ pt who declined any po's at this time. He denied having any trouble swallowing; endorsed his back hurts if he "sits up too much" or moves around much. NSG reported pt was tolerating the current dysphagia diet well; swallowing Pills in puree(crushed) w/ no overt s/s of aspiration seen or reported w/ meals, pills in puree. He often does not want to sit fully upright w/ NSG to take po's despite encouragement. Pt is missing many Dentition.  Due to pt's Cognitive decline, and current restriction and challenge in sitting upright for oral intake, pt remains at an increased risk for aspiration thus is recommended to remain on the current diet to lessen risk for aspiration, choking. Recommend continuing a Minced diet (dys level 2) w/ gravies added; Nectar consistency liquids w/ aspiration precautions. Pt can be followed at SNF for ongoing assessment and trials to upgrade diet when appropriate and safe for pt. Recommend frequent oral care for hygiene and stimulation of swallowing.     Orinda Kenner, MS, CCC-SLP Watson,Katherine 11/18/2019, 11:27 AM

## 2019-11-18 NOTE — Progress Notes (Signed)
   11/18/19 1300  Clinical Encounter Type  Visited With Patient;Health care provider  Visit Type Initial  Referral From Nurse   Chaplain received a referral to support the patient from his nurse. Upon arrival, the patient was observed to be laying supine in his bed. The patient was welcoming, but quiet during our brief visit. His hospital gown covered him from the waist down and he reported that he was feeling a bit cold. The patient requested water to drink. This chaplain immediately relayed these needs to the patient's nurse. This chaplain provided support in the form of compassionate ministerial presence, words of encouragement, and social support. The patient expressed gratitude for the visit and welcomes follow-up care.

## 2019-11-18 NOTE — Progress Notes (Signed)
Patient ID: Tony Flores, male   DOB: Mar 10, 1930, 85 y.o.   MRN: [id number] [facility] PROGRESS NOTE  Tony Flores [id number]:[id number] DOB: July 06, 1930 DOA: 11/06/2019 PCP: Patient, No Pcp Per  HPI/Subjective: Patient seen around breakfast time.  Breakfast was just lying on the table next to him.  He did not eat very much.  He only drank very little.  Complains of some back pain.  Objective: Vitals:   11/18/19 0110 11/18/19 0750  BP: (!) 161/85 140/83  Pulse: 73 79  Resp:  17  Temp:  98.5 F (36.9 C)  SpO2:  92%    Intake/Output Summary (Last 24 hours) at 11/18/2019 1250 Last data filed at 11/18/2019 0640 Gross per 24 hour  Intake -  Output 450 ml  Net -450 ml   Filed Weights   11/06/19 1651 11/07/19 0530  Weight: 59 kg 70.3 kg    ROS: Review of Systems  Unable to perform ROS: Dementia  Respiratory: Negative for shortness of breath.   Cardiovascular: Negative for chest pain.  Gastrointestinal: Negative for abdominal pain.  Musculoskeletal: Positive for back pain.   Exam: Physical Exam  HENT:  Nose: No mucosal edema.  Mouth/Throat: No oropharyngeal exudate or posterior oropharyngeal edema.  Eyes: Pupils are equal, round, and reactive to light. Conjunctivae, EOM and lids are normal.  Neck: No JVD present. Carotid bruit is not present. No edema present. No thyroid mass and no thyromegaly present.  Cardiovascular: S1 normal and S2 normal. Exam reveals no gallop.  No murmur heard. Pulses:      Dorsalis pedis pulses are 2+ on the right side and 2+ on the left side.  Respiratory: No respiratory distress. He has no wheezes. He has no rhonchi. He has no rales.  GI: Soft. Bowel sounds are normal. There is no abdominal tenderness.  Musculoskeletal:     Right ankle: He exhibits no swelling.     Left ankle: He exhibits no swelling.  Lymphadenopathy:    He has no cervical adenopathy.  Neurological: He is alert. No cranial nerve deficit.  Patient was able to straight  leg raise barely off the bed.  Skin: Skin is warm. Nails show no clubbing.  Chronic lower extremity skin discoloration.  Psychiatric: He has a normal mood and affect.      Data Reviewed: Basic Metabolic Panel: Recent Labs  Lab 11/12/19 0431 11/13/19 0541 11/14/19 0520 11/15/19 0511  NA 139 140 141 143  K 3.7 3.8 3.8 3.5  CL 101 101 104 102  CO2 27 27 26 29   GLUCOSE 138* 143* 160* 143*  BUN 15 17 20 18   CREATININE 0.56* 0.53* 0.49* 0.48*  CALCIUM 8.3* 8.6* 8.5* 8.8*   CBC: Recent Labs  Lab 11/12/19 0431 11/13/19 0541 11/14/19 0520 11/15/19 0511  WBC 6.5 7.0 6.6 6.5  HGB 12.8* 12.9* 12.4* 14.4  HCT 38.5* 37.7* 38.0* 42.2  MCV 89.3 86.5 90.0 87.0  PLT 214 199 212 236    CBG: Recent Labs  Lab 11/17/19 1134 11/17/19 1654 11/17/19 2105 11/18/19 0749 11/18/19 1150  GLUCAP 166* 116* 224* 158* 168*       Scheduled Meds: . atenolol  50 mg Oral Daily  . enoxaparin (LOVENOX) injection  40 mg Subcutaneous Q24H  . influenza vaccine adjuvanted  0.5 mL Intramuscular Tomorrow-1000  . ketotifen  1 drop Left Eye BID  . mirtazapine  15 mg Oral QHS  . multivitamin with minerals  1 tablet Oral Daily  . polyethylene glycol  17 g  Oral Daily  . senna-docusate  1 tablet Oral BID   Assessment/Plan:  1. Acute metabolic encephalopathy with underlying dementia.  Patient was seen by psychiatry and they deemed him incompetent to make medical decisions at this time.  Spoke with patient's brother yesterday afternoon.  2. L1-L2 fracture.  Seen by neurosurgery and they wish to proceed with a procedure but unable to get consent from patient.  Care manager gave Dr. Adriana Simas the number of the patient's brother Jonny Ruiz.  Await final recommendations from neurosurgery prior to disposition. 3. Choking on liquids.  Patient placed on dysphagia 2 with nectar thick liquids by speech therapy.  Patient high risk for aspiration. 4. Essential hypertension on atenolol 5. Impaired fasting glucose.   Hemoglobin A1c 5.7.  Patient not a diabetic. 6. Patient with poor appetite  Code Status:     Code Status Orders  (From admission, onward)         Start     Ordered   11/06/19 2318  Full code  Continuous     11/06/19 2319        Code Status History    This patient has a current code status but no historical code status.   Advance Care Planning Activity     Disposition Plan: Likely will need rehab.  Consultants:  Neurosurgery  Time spent: 27 minutes.  Spoke with the patient's brother yesterday afternoon at [phone].  Emmalena Canny [employer]  [facility] [employer]

## 2019-11-19 LAB — BASIC METABOLIC PANEL
Anion gap: 10 (ref 5–15)
BUN: 26 mg/dL — ABNORMAL HIGH (ref 8–23)
CO2: 30 mmol/L (ref 22–32)
Calcium: 8.7 mg/dL — ABNORMAL LOW (ref 8.9–10.3)
Chloride: 104 mmol/L (ref 98–111)
Creatinine, Ser: 0.62 mg/dL (ref 0.61–1.24)
GFR calc Af Amer: >60 mL/min (ref >60)
GFR calc non Af Amer: >60 mL/min (ref >60)
Glucose, Bld: 144 mg/dL — ABNORMAL HIGH (ref 70–99)
Potassium: 3.6 mmol/L (ref 3.5–5.1)
Sodium: 144 mmol/L (ref 135–145)

## 2019-11-19 LAB — GLUCOSE, CAPILLARY
Glucose-Capillary: 166 mg/dL — ABNORMAL HIGH (ref 70–99)
Glucose-Capillary: 132 mg/dL — ABNORMAL HIGH (ref 70–99)
Glucose-Capillary: 145 mg/dL — ABNORMAL HIGH (ref 70–99)
Glucose-Capillary: 149 mg/dL — ABNORMAL HIGH (ref 70–99)
Glucose-Capillary: 139 mg/dL — ABNORMAL HIGH (ref 70–99)

## 2019-11-19 LAB — CBC
HCT: 42.2 % (ref 39.0–52.0)
Hemoglobin: 14.1 g/dL (ref 13.0–17.0)
MCH: 29.3 pg (ref 26.0–34.0)
MCHC: 33.4 g/dL (ref 30.0–36.0)
MCV: 87.7 fL (ref 80.0–100.0)
Platelets: 253 10*3/uL (ref 150–400)
RBC: 4.81 MIL/uL (ref 4.22–5.81)
RDW: 13.5 % (ref 11.5–15.5)
WBC: 6.4 10*3/uL (ref 4.0–10.5)
nRBC: 0 % (ref 0.0–0.2)

## 2019-11-19 MED ORDER — LORAZEPAM 2 MG/ML IJ SOLN: 0.5000 mg | Freq: Once | INTRAMUSCULAR | Status: DC | PRN

## 2019-11-19 NOTE — Progress Notes (Signed)
Tony Flores ID: [name], male   DOB: [birth year], 85 y.o.   MRN: [id number] [facility] PROGRESS NOTE  TERRI MALERBA ACZ:[id number] DOB: 1930-02-07 DOA: 11/06/2019 PCP: Tony Flores, No Pcp Per  HPI/Subjective: Tony Flores seen this morning.  Breakfast was just sitting on the tray.  I fed him 1 bite of eggs and one bite of sausage.  Then I fed him the thickened liquid shake.  He did drink 2 large sips through a straw with me.  Spoke with the nursing staff and he is a full care with respect to feeding.  Objective: Vitals:   11/19/19 0029 11/19/19 0756  BP: 106/69 (!) 151/77  Pulse: 62 62  Resp: 18 17  Temp: (!) 96.8 F (36 C) 97.8 F (36.6 C)  SpO2: 99% 92%    Intake/Output Summary (Last 24 hours) at 11/19/2019 1220 Last data filed at 11/19/2019 0038 Gross per 24 hour  Intake -  Output 350 ml  Net -350 ml   Filed Weights   11/06/19 1651 11/07/19 0530  Weight: 59 kg 70.3 kg    ROS: Review of Systems  Unable to perform ROS: Dementia  Respiratory: Negative for shortness of breath.   Cardiovascular: Negative for chest pain.  Gastrointestinal: Negative for abdominal pain.  Musculoskeletal: Positive for back pain.   Exam: Physical Exam  HENT:  Nose: No mucosal edema.  Mouth/Throat: No oropharyngeal exudate or posterior oropharyngeal edema.  Eyes: Pupils are equal, round, and reactive to light. Conjunctivae, EOM and lids are normal.  Neck: No JVD present. Carotid bruit is not present. No edema present. No thyroid mass and no thyromegaly present.  Cardiovascular: S1 normal and S2 normal. Exam reveals no gallop.  No murmur heard. Pulses:      Dorsalis pedis pulses are 2+ on the right side and 2+ on the left side.  Respiratory: No respiratory distress. He has no wheezes. He has no rhonchi. He has no rales.  GI: Soft. Bowel sounds are normal. There is no abdominal tenderness.  Musculoskeletal:     Right ankle: He exhibits no swelling.     Left ankle: He exhibits no  swelling.  Lymphadenopathy:    He has no cervical adenopathy.  Neurological: He is alert. No cranial nerve deficit.  Tony Flores was able to straight leg raise bilaterally.  Skin: Skin is warm. Nails show no clubbing.  Chronic lower extremity skin discoloration.  Psychiatric: He has a normal mood and affect.      Data Reviewed: Basic Metabolic Panel: Recent Labs  Lab 11/13/19 0541 11/14/19 0520 11/15/19 0511 11/19/19 0455  NA 140 141 143 144  K 3.8 3.8 3.5 3.6  CL 101 104 102 104  CO2 27 26 29 30   GLUCOSE 143* 160* 143* 144*  BUN 17 20 18  26*  CREATININE 0.53* 0.49* 0.48* 0.62  CALCIUM 8.6* 8.5* 8.8* 8.7*   CBC: Recent Labs  Lab 11/13/19 0541 11/14/19 0520 11/15/19 0511 11/19/19 0455  WBC 7.0 6.6 6.5 6.4  HGB 12.9* 12.4* 14.4 14.1  HCT 37.7* 38.0* 42.2 42.2  MCV 86.5 90.0 87.0 87.7  PLT 199 212 236 253    CBG: Recent Labs  Lab 11/18/19 0749 11/18/19 1150 11/18/19 1643 11/19/19 0756 11/19/19 1137  GLUCAP 158* 168* 148* 132* 145*       Scheduled Meds: . atenolol  50 mg Oral Daily  . enoxaparin (LOVENOX) injection  40 mg Subcutaneous Q24H  . influenza vaccine adjuvanted  0.5 mL Intramuscular Tomorrow-1000  . ketotifen  1 drop  Left Eye BID  . mirtazapine  15 mg Oral QHS  . multivitamin with minerals  1 tablet Oral Daily  . polyethylene glycol  17 g Oral Daily  . senna-docusate  1 tablet Oral BID   Assessment/Plan:  1. Acute metabolic encephalopathy with underlying dementia.  Tony Flores was seen by psychiatry and they deemed him incompetent to make medical decisions at this time.  Tried to reach the Tony Flores's brother Jonny Ruiz today at [phone]. 2. L1-L2 fracture.  Seen by neurosurgery and they wish to proceed with a procedure but unable to get consent from Tony Flores.  Care manager gave Dr. Adriana Simas the number of the Tony Flores's brother Jonny Ruiz.  Await final recommendations from neurosurgery prior to disposition.  Hopefully we can set up a plan one where another early in  the week. 3. Choking on liquids.  Tony Flores placed on dysphagia 2 with nectar thick liquids by speech therapy.  Tony Flores high risk for aspiration. 4. Essential hypertension on atenolol 5. Impaired fasting glucose.  Hemoglobin A1c 5.7.  Tony Flores not a diabetic. 6. Tony Flores with poor appetite.  I fed the Tony Flores this morning and the staff will try to feed him more.  Code Status:     Code Status Orders  (From admission, onward)         Start     Ordered   11/06/19 2318  Full code  Continuous     11/06/19 2319        Code Status History    This Tony Flores has a current code status but no historical code status.   Advance Care Planning Activity     Disposition Plan: Likely will need rehab.  Consultants:  Neurosurgery  Time spent: 27 minutes.  Stepheni Cameron [employer]  [facility] [employer]

## 2019-11-20 ENCOUNTER — Inpatient Hospital Stay: Payer: Medicare HMO

## 2019-11-20 DIAGNOSIS — E876 Hypokalemia: Secondary | ICD-10-CM

## 2019-11-20 LAB — GLUCOSE, CAPILLARY
Glucose-Capillary: 115 mg/dL — ABNORMAL HIGH (ref 70–99)
Glucose-Capillary: 141 mg/dL — ABNORMAL HIGH (ref 70–99)
Glucose-Capillary: 143 mg/dL — ABNORMAL HIGH (ref 70–99)
Glucose-Capillary: 130 mg/dL — ABNORMAL HIGH (ref 70–99)

## 2019-11-20 NOTE — Progress Notes (Signed)
Patient ID: Tony Flores, male   DOB: April 11, 1930, 85 y.o.   MRN: [id number] [facility] PROGRESS NOTE  ISAM UNREIN VQM:[id number] DOB: 1930-07-13 DOA: 11/06/2019 PCP: Patient, No Pcp Per  HPI/Subjective: The patient does have some back pain.  He states he is trying to eat but cannot tell me how much he ate.  No complaints of chest pain or shortness of breath.  Objective: Vitals:   11/19/19 2309 11/20/19 0811  BP: 115/74 (!) 167/95  Pulse: 67 69  Resp: 19 17  Temp: 98.4 F (36.9 C) 97.7 F (36.5 C)  SpO2: 93% 94%    Intake/Output Summary (Last 24 hours) at 11/20/2019 1306 Last data filed at 11/20/2019 9326 Gross per 24 hour  Intake 120 ml  Output 50 ml  Net 70 ml   Filed Weights   11/06/19 1651 11/07/19 0530  Weight: 59 kg 70.3 kg    ROS: Review of Systems  Unable to perform ROS: Dementia  Respiratory: Negative for shortness of breath.   Cardiovascular: Negative for chest pain.  Gastrointestinal: Negative for abdominal pain.  Musculoskeletal: Positive for back pain.   Exam: Physical Exam  HENT:  Nose: No mucosal edema.  Mouth/Throat: No oropharyngeal exudate or posterior oropharyngeal edema.  Eyes: Pupils are equal, round, and reactive to light. Conjunctivae, EOM and lids are normal.  Neck: No JVD present. Carotid bruit is not present. No edema present. No thyroid mass and no thyromegaly present.  Cardiovascular: S1 normal and S2 normal. Exam reveals no gallop.  No murmur heard. Pulses:      Dorsalis pedis pulses are 2+ on the right side and 2+ on the left side.  Respiratory: No respiratory distress. He has no wheezes. He has no rhonchi. He has no rales.  GI: Soft. Bowel sounds are normal. There is no abdominal tenderness.  Musculoskeletal:     Right ankle: He exhibits no swelling.     Left ankle: He exhibits no swelling.  Lymphadenopathy:    He has no cervical adenopathy.  Neurological: He is alert. No cranial nerve deficit.  Patient was able to  straight leg raise bilaterally.  Skin: Skin is warm. Nails show no clubbing.  Chronic lower extremity skin discoloration.  Psychiatric: He has a normal mood and affect.      Data Reviewed: Basic Metabolic Panel: Recent Labs  Lab 11/14/19 0520 11/15/19 0511 11/19/19 0455  NA 141 143 144  K 3.8 3.5 3.6  CL 104 102 104  CO2 26 29 30   GLUCOSE 160* 143* 144*  BUN 20 18 26*  CREATININE 0.49* 0.48* 0.62  CALCIUM 8.5* 8.8* 8.7*   CBC: Recent Labs  Lab 11/14/19 0520 11/15/19 0511 11/19/19 0455  WBC 6.6 6.5 6.4  HGB 12.4* 14.4 14.1  HCT 38.0* 42.2 42.2  MCV 90.0 87.0 87.7  PLT 212 236 253    CBG: Recent Labs  Lab 11/19/19 1137 11/19/19 1659 11/19/19 2140 11/20/19 0813 11/20/19 1208  GLUCAP 145* 149* 139* 115* 141*       Scheduled Meds: . atenolol  50 mg Oral Daily  . enoxaparin (LOVENOX) injection  40 mg Subcutaneous Q24H  . influenza vaccine adjuvanted  0.5 mL Intramuscular Tomorrow-1000  . ketotifen  1 drop Left Eye BID  . mirtazapine  15 mg Oral QHS  . multivitamin with minerals  1 tablet Oral Daily  . polyethylene glycol  17 g Oral Daily  . senna-docusate  1 tablet Oral BID   Assessment/Plan:  1. Acute metabolic encephalopathy  with underlying dementia.  Patient was seen by psychiatry and they deemed him incompetent to make medical decisions at this time.  Patient able to answer some simple questions.  Does not elaborate very much. 2. L1-L2 fracture.  Neurosurgery wanted me to repeat imaging of his back which MRI showing horizontal fracture through L1-L2 disc space with similar degree of anterior distraction.  I was able to reach the patient's brother [name] at [phone].  [name]'s caregiver Vania Rea did give me his phone number which he will answer and put [name] on the phone on speaker is [phone]. 3. Choking on liquids.  Patient placed on dysphagia 2 with nectar thick liquids by speech therapy.  Patient high risk for aspiration. 4. Essential hypertension on  atenolol 5. Impaired fasting glucose.  Hemoglobin A1c 5.7.  Patient not a diabetic. 6. Patient with poor appetite.  Code Status:     Code Status Orders  (From admission, onward)         Start     Ordered   11/06/19 2318  Full code  Continuous     11/06/19 2319        Code Status History    This patient has a current code status but no historical code status.   Advance Care Planning Activity     Disposition Plan: Likely will need rehab.  Consultants:  Neurosurgery  Time spent: 27 minutes.  I spoke with patient's brother [name] and his caregiver on the phone.  [place]  [facility] [employer]

## 2019-11-20 NOTE — Plan of Care (Signed)
  Problem: Education: Goal: Knowledge of General Education information will improve Description: Including pain rating scale, medication(s)/side effects and non-pharmacologic comfort measures Outcome: Progressing   Problem: Clinical Measurements: Goal: Will remain free from infection Outcome: Progressing   Problem: Activity: Goal: Risk for activity intolerance will decrease Outcome: Progressing   Problem: Nutrition: Goal: Adequate nutrition will be maintained Outcome: Progressing   Problem: Coping: Goal: Level of anxiety will decrease Outcome: Progressing   Problem: Elimination: Goal: Will not experience complications related to bowel motility Outcome: Progressing Goal: Will not experience complications related to urinary retention Outcome: Progressing   

## 2019-11-21 LAB — GLUCOSE, CAPILLARY
Glucose-Capillary: 128 mg/dL — ABNORMAL HIGH (ref 70–99)
Glucose-Capillary: 167 mg/dL — ABNORMAL HIGH (ref 70–99)
Glucose-Capillary: 137 mg/dL — ABNORMAL HIGH (ref 70–99)

## 2019-11-21 NOTE — Progress Notes (Signed)
  Speech Language Pathology Treatment: Dysphagia  Patient Details Name: Tony Flores MRN: [id number] DOB: 1930/10/10 Today's Date: 11/21/2019 Time: 2330-0762 SLP Time Calculation (min) (ACUTE ONLY): 23 min  Assessment / Plan / Recommendation Clinical Impression  This unfortunate 85 y/o male continues to present with oropharyngeal dysphagia with decreased cognition & safety awareness. Pt has been having difficulty sitting fully upright due to L1-L2 fracture (see MD notes for more details), SLP was able to position chin level to ground with 2 pillows behind pt's head. Nsg reported PO intake has been poor, and pt is frequently requesting "regular milk and water." SLP attempting trials of thin liquid for possible diet upgrade. Pt observed to take 1 single sip thin liquid (whole milk) followed by positive s/s aspiration, an immediate bout of coughing. Pt then observed to tolerate single large sip nectar thick liquid with no overt s/s aspiration. Pt held cup given assistance to feed self, however pt often impulsive taking large sips and attempting to take multiple sips. SLP limited sips to single sips for safety. Pt refused further trials, but did request "ice cream" on upcoming trays. SLP to ensure next tray to include "magic cup" ice cream for safer swallowing.    Due to pt's Cognitive decline, and current restriction and challenge in sitting upright for oral intake, pt continues to remain at an increased risk for aspiration thus is recommended to remain on the current diet to lessen risk for aspiration, choking. Recommend continuing a Minced diet (dys level 2) w/ gravies added; Nectar consistency liquids w/ aspiration precautions. Discussed performance in tx and above recommendations w/nsg, nsg stated understanding and agreement.  Pt can be followed at SNF for ongoing assessment and trials to upgrade diet when appropriate and safe for pt. Recommend frequent oral care for hygiene and stimulation of  swallowing. SLP to sign off at this time, please re-consult with any future change in status requiring re-assessment.   HPI HPI: Per Admitting H&P: Tony Flores is a 85 y.o. male with medical history significant for type 2 diabetes, hypertension, COPD, and tobacco use who presents to the ED from his independent living facility via EMS with altered mental status and an unwitnessed fall.  History limited from patient due to encephalopathy therefore majority history is supplemented by EDP and chart review.      SLP Plan  Continue with current plan of care       Recommendations  Diet recommendations: Dysphagia 2 (fine chop);Nectar-thick liquid Liquids provided via: Cup Medication Administration: Crushed with puree Supervision: Staff to assist with self feeding;Full supervision/cueing for compensatory strategies Compensations: Minimize environmental distractions;Slow rate;Small sips/bites Postural Changes and/or Swallow Maneuvers: Seated upright 90 degrees;Upright 30-60 min after meal                Oral Care Recommendations: Oral care QID;Staff/trained caregiver to provide oral care Follow up Recommendations: Skilled Nursing facility SLP Visit Diagnosis: Dysphagia, oropharyngeal phase (R13.12) Plan: Continue with current plan of care       GO                Adjoa Althouse, MA, CCC-SLP 11/21/2019, 11:37 AM

## 2019-11-21 NOTE — Progress Notes (Signed)
Patient ID: Tony Flores, male   DOB: May 04, 1930, 85 y.o.   MRN: [id number] [facility] PROGRESS NOTE  [name] [id number]:[id number] DOB: 1930/12/04 DOA: 11/06/2019 PCP: Patient, No Pcp Per  HPI/Subjective: The patient has some back pain.  Trying to eat more.  Offers no other complaints.  Objective: Vitals:   11/20/19 2339 11/21/19 0750  BP: 137/66 (!) 169/87  Pulse: 66 66  Resp: 18 18  Temp: 98.4 F (36.9 C) 98.6 F (37 C)  SpO2: 90% 92%    Intake/Output Summary (Last 24 hours) at 11/21/2019 1508 Last data filed at 11/21/2019 1021 Gross per 24 hour  Intake -  Output 100 ml  Net -100 ml   Filed Weights   11/06/19 1651 11/07/19 0530  Weight: 59 kg 70.3 kg    ROS: Review of Systems  Unable to perform ROS: Dementia  Respiratory: Negative for shortness of breath.   Cardiovascular: Negative for chest pain.  Gastrointestinal: Negative for abdominal pain.  Musculoskeletal: Positive for back pain.   Exam: Physical Exam  HENT:  Nose: No mucosal edema.  Mouth/Throat: No oropharyngeal exudate or posterior oropharyngeal edema.  Eyes: Pupils are equal, round, and reactive to light. Conjunctivae, EOM and lids are normal.  Neck: No JVD present. Carotid bruit is not present. No edema present. No thyroid mass and no thyromegaly present.  Cardiovascular: S1 normal and S2 normal. Exam reveals no gallop.  No murmur heard. Pulses:      Dorsalis pedis pulses are 2+ on the right side and 2+ on the left side.  Respiratory: No respiratory distress. He has no wheezes. He has no rhonchi. He has no rales.  GI: Soft. Bowel sounds are normal. There is no abdominal tenderness.  Musculoskeletal:     Right ankle: He exhibits no swelling.     Left ankle: He exhibits no swelling.  Lymphadenopathy:    He has no cervical adenopathy.  Neurological: He is alert. No cranial nerve deficit.  Patient was able to straight leg raise bilaterally.  Skin: Skin is warm. Nails show no clubbing.   Chronic lower extremity skin discoloration.  Psychiatric: He has a normal mood and affect.      Data Reviewed: Basic Metabolic Panel: Recent Labs  Lab 11/15/19 0511 11/19/19 0455  NA 143 144  K 3.5 3.6  CL 102 104  CO2 29 30  GLUCOSE 143* 144*  BUN 18 26*  CREATININE 0.48* 0.62  CALCIUM 8.8* 8.7*   CBC: Recent Labs  Lab 11/15/19 0511 11/19/19 0455  WBC 6.5 6.4  HGB 14.4 14.1  HCT 42.2 42.2  MCV 87.0 87.7  PLT 236 253    CBG: Recent Labs  Lab 11/20/19 1208 11/20/19 1702 11/20/19 2130 11/21/19 0748 11/21/19 1155  GLUCAP 141* 143* 130* 128* 167*       Scheduled Meds: . atenolol  50 mg Oral Daily  . enoxaparin (LOVENOX) injection  40 mg Subcutaneous Q24H  . influenza vaccine adjuvanted  0.5 mL Intramuscular Tomorrow-1000  . ketotifen  1 drop Left Eye BID  . mirtazapine  15 mg Oral QHS  . multivitamin with minerals  1 tablet Oral Daily  . polyethylene glycol  17 g Oral Daily  . senna-docusate  1 tablet Oral BID   Assessment/Plan:  1. Acute metabolic encephalopathy with underlying dementia.  Patient was seen by psychiatry and they deemed him incompetent to make medical decisions at this time.  Patient pleasant and answers questions. 2. L1-L2 fracture.  Neurosurgery spoke with family  yesterday and they will get back to him today on whether or not to do surgery or conservative management. 3. Choking on liquids.  Patient placed on dysphagia 2 with nectar thick liquids by speech therapy.  Patient high risk for aspiration. 4. Essential hypertension on atenolol 5. Impaired fasting glucose.  Hemoglobin A1c 5.7.  Patient not a diabetic. 6. Patient with poor appetite.  Code Status:     Code Status Orders  (From admission, onward)         Start     Ordered   11/06/19 2318  Full code  Continuous     11/06/19 2319        Code Status History    This patient has a current code status but no historical code status.   Advance Care Planning Activity      Disposition Plan: Depending on whether or not the patient will require surgery.  Consultants:  Neurosurgery  Time spent: 25 minutes.  I spoke with patient's brother Tony Flores and his caregiver on the phone yesterday.  Tony Flores [employer]  [facility] [employer]

## 2019-11-22 LAB — GLUCOSE, CAPILLARY
Glucose-Capillary: 139 mg/dL — ABNORMAL HIGH (ref 70–99)
Glucose-Capillary: 141 mg/dL — ABNORMAL HIGH (ref 70–99)
Glucose-Capillary: 167 mg/dL — ABNORMAL HIGH (ref 70–99)
Glucose-Capillary: 139 mg/dL — ABNORMAL HIGH (ref 70–99)
Glucose-Capillary: 152 mg/dL — ABNORMAL HIGH (ref 70–99)

## 2019-11-22 LAB — BASIC METABOLIC PANEL
Anion gap: 13 (ref 5–15)
BUN: 31 mg/dL — ABNORMAL HIGH (ref 8–23)
CO2: 26 mmol/L (ref 22–32)
Calcium: 8.8 mg/dL — ABNORMAL LOW (ref 8.9–10.3)
Chloride: 109 mmol/L (ref 98–111)
Creatinine, Ser: 0.7 mg/dL (ref 0.61–1.24)
GFR calc Af Amer: >60 mL/min (ref >60)
GFR calc non Af Amer: >60 mL/min (ref >60)
Glucose, Bld: 164 mg/dL — ABNORMAL HIGH (ref 70–99)
Potassium: 3.9 mmol/L (ref 3.5–5.1)
Sodium: 148 mmol/L — ABNORMAL HIGH (ref 135–145)

## 2019-11-22 MED ORDER — SODIUM CHLORIDE 0.9% FLUSH: 3.0000 mL | INTRAVENOUS | Status: DC | PRN

## 2019-11-22 MED ORDER — HALOPERIDOL LACTATE 5 MG/ML IJ SOLN: 0.5000 mg | INTRAMUSCULAR | Status: DC | PRN

## 2019-11-22 MED ORDER — SODIUM CHLORIDE 0.9 % IV SOLN: 250.0000 mL | INTRAVENOUS | Status: DC | PRN

## 2019-11-22 MED ORDER — POLYVINYL ALCOHOL 1.4 % OP SOLN
1.0000 [drp] | Freq: Four times a day (QID) | OPHTHALMIC | Status: DC | PRN
  Filled 2019-11-22: qty 15

## 2019-11-22 MED ORDER — HALOPERIDOL LACTATE 2 MG/ML PO CONC
0.5000 mg | ORAL | Status: DC | PRN
  Filled 2019-11-22: qty 0.3

## 2019-11-22 MED ORDER — GLYCOPYRROLATE 0.2 MG/ML IJ SOLN
0.2000 mg | INTRAMUSCULAR | Status: DC | PRN
  Filled 2019-11-22: qty 1

## 2019-11-22 MED ORDER — ONDANSETRON HCL 4 MG/2ML IJ SOLN: 4.0000 mg | Freq: Four times a day (QID) | INTRAMUSCULAR | Status: DC | PRN

## 2019-11-22 MED ORDER — LORAZEPAM 2 MG/ML IJ SOLN: 1.0000 mg | INTRAMUSCULAR | Status: DC | PRN

## 2019-11-22 MED ORDER — ACETAMINOPHEN 650 MG RE SUPP: 650.0000 mg | Freq: Four times a day (QID) | RECTAL | Status: DC | PRN

## 2019-11-22 MED ORDER — ACETAMINOPHEN 325 MG PO TABS: 650.0000 mg | ORAL_TABLET | Freq: Four times a day (QID) | ORAL | Status: DC | PRN

## 2019-11-22 MED ORDER — SODIUM CHLORIDE 0.9% FLUSH
3.0000 mL | Freq: Two times a day (BID) | INTRAVENOUS | Status: DC
  Administered 2019-11-22 – 2019-11-24 (×6): 3 mL via INTRAVENOUS

## 2019-11-22 MED ORDER — BIOTENE DRY MOUTH MT LIQD: 15.0000 mL | OROMUCOSAL | Status: DC | PRN

## 2019-11-22 MED ORDER — GLYCOPYRROLATE 1 MG PO TABS
1.0000 mg | ORAL_TABLET | ORAL | Status: DC | PRN
  Filled 2019-11-22: qty 1

## 2019-11-22 MED ORDER — MORPHINE SULFATE (PF) 2 MG/ML IV SOLN
2.0000 mg | INTRAVENOUS | Status: DC | PRN
  Administered 2019-11-23 – 2019-11-25 (×4): 2 mg via INTRAVENOUS
  Filled 2019-11-22 (×4): qty 1

## 2019-11-22 MED ORDER — ONDANSETRON 4 MG PO TBDP
4.0000 mg | ORAL_TABLET | Freq: Four times a day (QID) | ORAL | Status: DC | PRN
  Filled 2019-11-22: qty 1

## 2019-11-22 MED ORDER — HALOPERIDOL 0.5 MG PO TABS
0.5000 mg | ORAL_TABLET | ORAL | Status: DC | PRN
  Filled 2019-11-22: qty 1

## 2019-11-22 MED ORDER — LORAZEPAM 2 MG/ML PO CONC: 1.0000 mg | ORAL | Status: DC | PRN

## 2019-11-22 MED ORDER — LORAZEPAM 1 MG PO TABS: 1.0000 mg | ORAL_TABLET | ORAL | Status: DC | PRN

## 2019-11-22 NOTE — Progress Notes (Signed)
Patient ID: Tony Flores, male   DOB: 04-Nov-1930, 85 y.o.   MRN: [id number] [facility] PROGRESS NOTE  ANGELA PLATNER VOH:[id number] DOB: 1930/04/11 DOA: 11/06/2019 PCP: Patient, No Pcp Per  HPI/Subjective: The patient was seen this morning and was lying on his right side.  He wanted to get over to his left side.  I removed the pillow that was underneath his buttocks so he can turn over.  He was unable to turn over on his own.  I asked the nursing staff to help him out turning to his other side.  Patient states he is trying to eat.  Objective: Vitals:   11/22/19 0021 11/22/19 0809  BP: 140/90 105/65  Pulse: 88 94  Resp:  18  Temp: 97.6 F (36.4 C) 97.9 F (36.6 C)  SpO2: 92% 92%    Intake/Output Summary (Last 24 hours) at 11/22/2019 1445 Last data filed at 11/22/2019 1300 Gross per 24 hour  Intake 0 ml  Output 200 ml  Net -200 ml   Filed Weights   11/06/19 1651 11/07/19 0530  Weight: 59 kg 70.3 kg    ROS: Review of Systems  Unable to perform ROS: Dementia  Respiratory: Negative for shortness of breath.   Cardiovascular: Negative for chest pain.  Gastrointestinal: Negative for abdominal pain.  Musculoskeletal: Positive for back pain.   Exam: Physical Exam  HENT:  Nose: No mucosal edema.  Mouth/Throat: No oropharyngeal exudate or posterior oropharyngeal edema.  Eyes: Pupils are equal, round, and reactive to light. Conjunctivae, EOM and lids are normal.  Neck: No JVD present. Carotid bruit is not present. No edema present. No thyroid mass and no thyromegaly present.  Cardiovascular: S1 normal and S2 normal. Exam reveals no gallop.  No murmur heard. Pulses:      Dorsalis pedis pulses are 2+ on the right side and 2+ on the left side.  Respiratory: No respiratory distress. He has no wheezes. He has no rhonchi. He has no rales.  GI: Soft. Bowel sounds are normal. There is no abdominal tenderness.  Musculoskeletal:     Right ankle: He exhibits no swelling.      Left ankle: He exhibits no swelling.  Lymphadenopathy:    He has no cervical adenopathy.  Neurological: He is alert. No cranial nerve deficit.  Patient was able to straight leg raise bilaterally.  Skin: Skin is warm. Nails show no clubbing.  Chronic lower extremity skin discoloration. Cystic mass on upper back. Bruising over the lumbar area  Psychiatric: He has a normal mood and affect.      Data Reviewed: Basic Metabolic Panel: Recent Labs  Lab 11/19/19 0455 11/22/19 0438  NA 144 148*  K 3.6 3.9  CL 104 109  CO2 30 26  GLUCOSE 144* 164*  BUN 26* 31*  CREATININE 0.62 0.70  CALCIUM 8.7* 8.8*   CBC: Recent Labs  Lab 11/19/19 0455  WBC 6.4  HGB 14.1  HCT 42.2  MCV 87.7  PLT 253    CBG: Recent Labs  Lab 11/21/19 0748 11/21/19 1155 11/21/19 1658 11/22/19 0806 11/22/19 1207  GLUCAP 128* 167* 137* 141* 167*       Scheduled Meds: . atenolol  50 mg Oral Daily  . enoxaparin (LOVENOX) injection  40 mg Subcutaneous Q24H  . influenza vaccine adjuvanted  0.5 mL Intramuscular Tomorrow-1000  . ketotifen  1 drop Left Eye BID  . mirtazapine  15 mg Oral QHS  . polyethylene glycol  17 g Oral Daily  . senna-docusate  1 tablet Oral BID  . sodium chloride flush  3 mL Intravenous Q12H   Assessment/Plan:  1. Acute metabolic encephalopathy with underlying dementia.  Patient was seen by psychiatry and they deemed him incompetent to make medical decisions at this time.  2. L1-L2 fracture.  Palliative care discussed case with the patient's brother.  They now declined surgery and are interested in comfort measures.  Patient made a DNR and hospice referral made.  Potential plan to hospice home when bed available. 3. Choking on liquids.  Patient placed on dysphagia 2 with nectar thick liquids by speech therapy. 4. Essential hypertension on atenolol 5. Impaired fasting glucose.  Hemoglobin A1c 5.7.  Patient not a diabetic. 6. Patient with poor appetite.  Code Status:      Code Status Orders  (From admission, onward)         Start     Ordered   11/06/19 2318  Full code  Continuous     11/06/19 2319        Code Status History    This patient has a current code status but no historical code status.   Advance Care Planning Activity     Disposition Plan: Hospice home when bed available  Consultants:  Neurosurgery  Palliative care  Time spent: 25 minutes.  Case discussed with palliative care and neurosurgery.    The patient's brother is named Jonny Ruiz and his phone number is [phone].  The brother's caregiver is named Gaylyn Rong and his phone number is [phone] and he can put the patient's brother Jonny Ruiz on the phone on speaker phone.  Horton Ellithorpe [employer]  [facility] [employer]

## 2019-11-22 NOTE — Progress Notes (Addendum)
Daily Progress Note   Patient Name: Tony Flores       Date: 11/22/2019 DOB: August 01, 1930  Age: 85 y.o. MRN#: [id number] Attending Physician: Loletha Grayer, MD Primary Care Physician: Patient, No Pcp Per Admit Date: 11/06/2019  Reason for Consultation/Follow-up: Establishing goals of care  Subjective: Patient is resting in bed. He can tell me his name, the year of 2020, the president (elect) is Biden, and that he is here because he "injuried my back badly." He cannot tell me where we are at this time. He states his back hurts, but is unsure if he would want surgery. He is unable to tell me who he would want to be his surrogate decision maker. Per staff, he is not eating or drinking well and does not like the recommended diet. Concern for prognosis in this case.    Spoke with brother Tony Flores via [employer]. We discussed Tony Flores's intake per staff, confusion, and prognosis. We discussed multiple scenarios including rehab and hospice, and his feelings on a feeding tube if patient's intake is poor. He understands with Tony Flores's confusion he could easily remove a feeding tube. We discussed surgery as a palliative measure with hospice at D/C. Tony Flores would not want to chance having to make a decision to withdraw care if Tony Flores is unable to wean from the ventilator postop, and would not want CPR even in the OR. Tony Flores would like to shift to comfort based care with placement at hospice facility.    I completed a MOST form today with brother Tony Flores through [place], and the signed original was placed in the chart. A photocopy was placed in the chart to be scanned into EMR. The patient outlined their wishes for the following treatment decisions:  Cardiopulmonary Resuscitation: Do Not Attempt Resuscitation (DNR/No  CPR)  Medical Interventions: Comfort Measures: Keep clean, warm, and dry. Use medication by any route, positioning, wound care, and other measures to relieve pain and suffering. Use oxygen, suction and manual treatment of airway obstruction as needed for comfort. Do not transfer to the hospital unless comfort needs cannot be met in current location.  Antibiotics: No antibiotics (use other measures to relieve symptoms)  IV Fluids: No IV fluids (provide other measures to ensure comfort)  Feeding Tube: No feeding tube    Length of Stay: 15  Current Medications:  Scheduled Meds:  . atenolol  50 mg Oral Daily  . enoxaparin (LOVENOX) injection  40 mg Subcutaneous Q24H  . influenza vaccine adjuvanted  0.5 mL Intramuscular Tomorrow-1000  . ketotifen  1 drop Left Eye BID  . mirtazapine  15 mg Oral QHS  . multivitamin with minerals  1 tablet Oral Daily  . polyethylene glycol  17 g Oral Daily  . senna-docusate  1 tablet Oral BID    Continuous Infusions:   PRN Meds: acetaminophen **OR** acetaminophen, bisacodyl, HYDROcodone-acetaminophen, LORazepam  Physical Exam Pulmonary:     Effort: Pulmonary effort is normal.  Neurological:     Mental Status: He is alert.             Vital Signs: BP 105/65 (BP Location: Left Arm)   Pulse 94   Temp 97.9 F (36.6 C)   Resp 18   Ht 5' 10" (1.778 m)   Wt 70.3 kg   SpO2 92%   BMI 22.24 kg/m  SpO2: SpO2: 92 % O2 Device: O2 Device: Room Air O2 Flow Rate: O2 Flow Rate (L/min): 3 L/min  Intake/output summary:   Intake/Output Summary (Last 24 hours) at 11/22/2019 1243 Last data filed at 11/22/2019 0900 Gross per 24 hour  Intake 0 ml  Output 200 ml  Net -200 ml   LBM: Last BM Date: 11/19/19 Baseline Weight: Weight: 59 kg Most recent weight: Weight: 70.3 kg       Palliative Assessment/Data: 20%      Patient Active Problem List   Diagnosis Date Noted  . Dementia without behavioral disturbance ([place])   . Closed fracture of first lumbar  vertebra with routine healing   . Impaired fasting glucose   . Advanced care planning/counseling discussion   . Palliative care by specialist   . Encounter for competency evaluation   . Acute low back pain without sciatica   . Closed fracture dislocation of lumbar spine ([place]) 11/07/2019  . Confusion   . Dehydration   . Acute metabolic encephalopathy 84/73/0856  . Type 2 diabetes mellitus ([place]) 11/06/2019  . Essential hypertension 11/06/2019  . COPD (chronic obstructive pulmonary disease) ([place]) 11/06/2019  . Hypokalemia 11/06/2019    Palliative Care Assessment & Plan    Recommendations/Plan:  Hospice facility placement   Code Status:    Code Status Orders  (From admission, onward)         Start     Ordered   11/06/19 2318  Full code  Continuous     11/06/19 2319        Code Status History    This patient has a current code status but no historical code status.   Advance Care Planning Activity       Prognosis:   < 2 weeks Poor PO intake. Dysphagia diet ordered but patient does not like the textures. Spine fractures with rec for operative management. Family declines.     Care plan was discussed with CM. MD IM'd.   Thank you for allowing the Palliative Medicine Team to assist in the care of this patient.   Time In: 1:00 Time Out: 2:12 Total Time 1 hour 12 min Prolonged Time Billed yes      Greater than 50%  of this time was spent counseling and coordinating care related to the above assessment and plan.  Asencion Gowda, NP  Please contact Palliative Medicine Team phone at [phone] for questions and concerns.

## 2019-11-22 NOTE — TOC Progression Note (Signed)
Transition of Care [facility]) - Progression Note    Patient Details  Name: Tony Flores MRN: [id number] Date of Birth: 09-06-1930  Transition of Care [facility] CM/SW Nord, RN Phone Number: 11/22/2019, 2:02 PM  Clinical Narrative:    After being notified by Crystal the Palliative care NP, the new plan is going to be [place], I called Santiago Glad with [facility] and provided the information for the [facility] referral, she stated no beds available at this time but she will take the referral        Expected Discharge Plan and Services                                                 Social Determinants of Health (SDOH) Interventions    Readmission Risk Interventions No flowsheet data found.

## 2019-11-22 NOTE — Progress Notes (Signed)
New referral for [facility] hospice home received from [facility]. Patient information faxed to referral and approved by hospice medical director. Writer spoke on the phone with patient's brother [name], he is in agreement with transfer to the hospice home, however writer was not able to provide education regarding hospice services. Writer to contact Mr. Inoue again tomorrow afternoon at his request. There are currently no beds available at the hospice home for transfer. Patient seen, appeared to be resting comfortably. Will continue to follow and update hospital team, patient and family. Flo Shanks BSN, RN, [place] [phone]

## 2019-11-22 NOTE — Progress Notes (Signed)
Nutrition Follow-up  RD working remotely.  DOCUMENTATION CODES:   Not applicable  INTERVENTION:  Provide Hormel Shake (Vital Cuisine) po TID with meals, each supplement provides 520 kcal and 22 grams of protein.  Provide Magic cup TID with meals, each supplement provides 290 kcal and 9 grams of protein.  Continue daily MVI.  Patient is now on day 16 of known poor PO intake (finishing 0-20% of meals). Consider re-consulting PMT or having discussion with patient's brother regarding poor nutrition and goals of care.  NUTRITION DIAGNOSIS:   Inadequate oral intake related to lethargy/confusion as evidenced by energy intake < or equal to 50% for > or equal to 5 days(per chart review, pt eating avg 15% of meals during admission; dehydration present on admission).  Ongoing.  GOAL:   Patient will meet greater than or equal to 90% of their needs  Not met.  MONITOR:   PO intake, Labs, I & O's, Supplement acceptance, Weight trends  REASON FOR ASSESSMENT:   Consult Assessment of nutrition requirement/status  ASSESSMENT:   85 year old male with medical history significant for T2DM, HTN, COPD who presented to ED from [facility] independent living facility s/p unwitnessed fall with AMS. Patient history limited due to encephalopathy, but he recalls that he was turning around in his kitchen when his left leg gave out on him resulting in mechanical fall.  Attempted to call patient over the phone but he was unable to answer. Appetite continues to be poor per chart. He is eating 0-20% of his meals still.   PMT was consulted but according to last note on 11/24 patient does not have decision making capacity and stepson does not want to make decisions for patient. It appears since then patient's brother [name] has been involved and communicating with team.  Medications reviewed and include: Remeron 15 mg QHS, MVI daily, Miralax, senna-docusate 1 tablet BID.  Labs reviewed: CBG 128-167, Sodium  148, BUN 31.  Diet Order:   Diet Order            Diet NPO time specified  Diet effective midnight        DIET DYS 2 Room service appropriate? Yes with Assist; Fluid consistency: Nectar Thick  Diet effective now             EDUCATION NEEDS:   No education needs have been identified at this time  Skin:  Skin Assessment: Reviewed RN Assessment  Last BM:  No BM charted since admission  Height:   Ht Readings from Last 1 Encounters:  11/06/19 _0  (1.778 m)   Weight:   Wt Readings from Last 1 Encounters:  11/07/19 70.3 kg   Ideal Body Weight:  75.5 kg  BMI:  Body mass index is 22.24 kg/m.  Estimated Nutritional Needs:   Kcal:  1800-2000  Protein:  90-100  Fluid:  >/= 1.7 L/day  Jacklynn Barnacle, MS, RD, LDN Office: [phone] Pager: [phone] After Hours/Weekend Pager: [phone]

## 2019-11-23 DIAGNOSIS — S32009D Unspecified fracture of unspecified lumbar vertebra, subsequent encounter for fracture with routine healing: Secondary | ICD-10-CM

## 2019-11-23 LAB — GLUCOSE, CAPILLARY
Glucose-Capillary: 138 mg/dL — ABNORMAL HIGH (ref 70–99)
Glucose-Capillary: 146 mg/dL — ABNORMAL HIGH (ref 70–99)
Glucose-Capillary: 134 mg/dL — ABNORMAL HIGH (ref 70–99)
Glucose-Capillary: 132 mg/dL — ABNORMAL HIGH (ref 70–99)

## 2019-11-23 NOTE — Progress Notes (Signed)
PROGRESS NOTE    Tony Flores  JIR:[id number] DOB: 02-24-1930 DOA: 11/06/2019  PCP: Patient, No Pcp Per    LOS - 16   Brief Narrative:  85 y.o. male with medical history significant for type 2 diabetes, hypertension, COPD, and tobacco use who presents to the ED from [facility] independent living facility via EMS with altered mental status and an unwitnessed fall with secondary back pain.    Apparently had several falls within the week.  Evaluation in the ED included head CT which was negative for acute findings but showed chronic ischemic small vessel disease and chronic sinusitis.  Chest x-ray showed atelectasis versus scarring at the lung bases but no focal consolidation edema or effusions.  MRI of lumbar spine showed L1-L2 fracture.  Labs are notable for potassium 3.1 lactic acid 2.3 troponin negative x2 white count 10.5.  Patient was admitted for further evaluation management.  Patient subsequently reported choking on liquids, was evaluated by speech therapy who recommended dysphagia level 2 and nectar thickened liquids with aspiration precautions patient seen by neurosurgery for lumbar fracture, had planned for intervention however patient lacks capacity to consent.   Subsequently, patient was seen by palliative care and his brother/surrogate have elected to avoid surgery and focus on comfort measures.  Patient awaiting residential hospice bed.  Subjective 12/2: Patient awake laying in bed, naked.  No acute events reported overnight.  He denies pain or discomfort but otherwise does not answer questions regarding any symptoms.  Assessment & Plan:   Principal Problem:   Acute metabolic encephalopathy Active Problems:   Type 2 diabetes mellitus (H[facility])   Essential hypertension   COPD (chronic obstructive pulmonary disease) (H[facility])   Hypokalemia   Closed fracture dislocation of lumbar spine (H[facility])   Confusion   Dehydration   Acute low back pain without sciatica   Encounter for  competency evaluation   Advanced care planning/counseling discussion   Palliative care by specialist   Dementia without behavioral disturbance ([place])   Closed fracture of first lumbar vertebra with routine healing   Impaired fasting glucose   Acute metabolic encephalopathy Dementia at baseline -Seen by psychiatry, deemed incompetent for medical decision-making  Lumbar fracture, L1-L2 -Family have declined surgery -Continue comfort measures until hospice bed available  Dysphagia -Continue dysphagia 2 diet with nectar thick liquids Speech therapy following  Essential hypertension, chronic stable continue home atenolol  Impaired fasting glucose Hemoglobin A1c 5.7%  Anorexia /poor appetite   DVT prophylaxis: None (comfort care)   Code Status: DNR  Family Communication: None at bedside Disposition Plan: To residential hospice pending bed   Consultants:   Palliative  Neurosurgery  Procedures:   None  Antimicrobials:   None   Objective: Vitals:   11/22/19 0809 11/22/19 1414 11/22/19 2004 11/23/19 0945  BP: 105/65 123/66 (!) 139/91 (!) 147/98  Pulse: 94 90 82 80  Resp: 18 18 17 16   Temp: 97.9 F (36.6 C)  98.2 F (36.8 C) 97.8 F (36.6 C)  TempSrc:    Oral  SpO2: 92% 92% 93% 96%  Weight:      Height:        Intake/Output Summary (Last 24 hours) at 11/23/2019 1226 Last data filed at 11/22/2019 1700 Gross per 24 hour  Intake 0 ml  Output -  Net 0 ml   Filed Weights   11/06/19 1651 11/07/19 0530  Weight: 59 kg 70.3 kg    Examination:  General exam: awake, alert, no acute distress, underweight Respiratory system: clear  to auscultation bilaterally, no wheezes, rales or rhonchi, normal respiratory effort. Cardiovascular system: normal S1/S2, RRR, no JVD, murmurs, rubs, gallops, no pedal edema.   Gastrointestinal system: soft, non-tender, non-distended abdomen, no organomegaly or masses felt, normal bowel sounds. Central nervous system: alert, no gross  focal neurologic deficits, normal speech Extremities: moves all, no edema, normal tone Skin: dry, intact, normal temperature   Data Reviewed: I have personally reviewed following labs and imaging studies  CBC: Recent Labs  Lab 11/19/19 0455  WBC 6.4  HGB 14.1  HCT 42.2  MCV 87.7  PLT 253   Basic Metabolic Panel: Recent Labs  Lab 11/19/19 0455 11/22/19 0438  NA 144 148*  K 3.6 3.9  CL 104 109  CO2 30 26  GLUCOSE 144* 164*  BUN 26* 31*  CREATININE 0.62 0.70  CALCIUM 8.7* 8.8*   GFR: Estimated Creatinine Clearance: 65.9 mL/min (by C-G formula based on SCr of 0.7 mg/dL). Liver Function Tests: No results for input(s): AST, ALT, ALKPHOS, BILITOT, PROT, ALBUMIN in the last 168 hours. No results for input(s): LIPASE, AMYLASE in the last 168 hours. No results for input(s): AMMONIA in the last 168 hours. Coagulation Profile: No results for input(s): INR, PROTIME in the last 168 hours. Cardiac Enzymes: No results for input(s): CKTOTAL, CKMB, CKMBINDEX, TROPONINI in the last 168 hours. BNP (last 3 results) No results for input(s): PROBNP in the last 8760 hours. HbA1C: No results for input(s): HGBA1C in the last 72 hours. CBG: Recent Labs  Lab 11/22/19 1207 11/22/19 1641 11/22/19 2123 11/23/19 0738 11/23/19 1136  GLUCAP 167* 139* 152* 138* 146*   Lipid Profile: No results for input(s): CHOL, HDL, LDLCALC, TRIG, CHOLHDL, LDLDIRECT in the last 72 hours. Thyroid Function Tests: No results for input(s): TSH, T4TOTAL, FREET4, T3FREE, THYROIDAB in the last 72 hours. Anemia Panel: No results for input(s): VITAMINB12, FOLATE, FERRITIN, TIBC, IRON, RETICCTPCT in the last 72 hours. Sepsis Labs: No results for input(s): PROCALCITON, LATICACIDVEN in the last 168 hours.  No results found for this or any previous visit (from the past 240 hour(s)).       Radiology Studies: No results found.      Scheduled Meds: . atenolol  50 mg Oral Daily  . ketotifen  1 drop Left  Eye BID  . mirtazapine  15 mg Oral QHS  . polyethylene glycol  17 g Oral Daily  . senna-docusate  1 tablet Oral BID  . sodium chloride flush  3 mL Intravenous Q12H   Continuous Infusions: . sodium chloride       LOS: 16 days    Time spent: 30-35 minutes    Pennie Banter, DO [facility] Pager: [phone]  If 7PM-7AM, please contact night-coverage [email or web address] Password [facility] 11/23/2019, 12:26 PM

## 2019-11-23 NOTE — Progress Notes (Signed)
Follow up visit made to new referral for [facility] hospice home. Patient seen lying in bed, appeared to be sleeping, breathing regular and unlabored. Per staff RN Debi, patient has received a PRN dose of IV morphine for back pain, he has not eaten or taken his oral medications today. No bed available at the hospice home for transfer today. Hospital care team aware.  Will continue to follow. Flo Shanks BSN, RN, [place] [phone]

## 2019-11-23 NOTE — Progress Notes (Signed)
Daily Progress Note   Patient Name: Tony Flores       Date: 11/23/2019 DOB: Feb 17, 1930  Age: 85 y.o. MRN#: [id number] Attending Physician: Pennie Banter, DO Primary Care Physician: Patient, No Pcp Per Admit Date: 11/06/2019  Reason for Consultation/Follow-up: Establishing goals of care  Subjective: Patient is resting in bed with eyes closed. He is arousable. He asks for water. He denies back pain or other complaint. Spoke with brother Jonny Ruiz, no questions at this time. Patient is waiting for a bed at hospice facility.   Length of Stay: 16  Current Medications: Scheduled Meds:  . atenolol  50 mg Oral Daily  . influenza vaccine adjuvanted  0.5 mL Intramuscular Tomorrow-1000  . ketotifen  1 drop Left Eye BID  . mirtazapine  15 mg Oral QHS  . polyethylene glycol  17 g Oral Daily  . senna-docusate  1 tablet Oral BID  . sodium chloride flush  3 mL Intravenous Q12H    Continuous Infusions: . sodium chloride      PRN Meds: sodium chloride, acetaminophen **OR** acetaminophen, antiseptic oral rinse, bisacodyl, glycopyrrolate **OR** glycopyrrolate **OR** glycopyrrolate, haloperidol **OR** haloperidol **OR** haloperidol lactate, LORazepam **OR** [DISCONTINUED] LORazepam **OR** LORazepam, morphine injection, ondansetron **OR** ondansetron (ZOFRAN) IV, polyvinyl alcohol, sodium chloride flush  Physical Exam Pulmonary:     Effort: Pulmonary effort is normal.  Neurological:     Mental Status: He is alert.             Vital Signs: BP (!) 147/98 (BP Location: Left Arm)   Pulse 80   Temp 97.8 F (36.6 C) (Oral)   Resp 16   Ht 5\' 10"  (1.778 m)   Wt 70.3 kg   SpO2 96%   BMI 22.24 kg/m  SpO2: SpO2: 96 % O2 Device: O2 Device: Room Air O2 Flow Rate: O2 Flow Rate (L/min): 3 L/min   Intake/output summary:   Intake/Output Summary (Last 24 hours) at 11/23/2019 1158 Last data filed at 11/22/2019 1700 Gross per 24 hour  Intake 0 ml  Output -  Net 0 ml   LBM: Last BM Date: 11/22/19 Baseline Weight: Weight: 59 kg Most recent weight: Weight: 70.3 kg       Palliative Assessment/Data: 20%      Patient Active Problem List   Diagnosis Date Noted  . Dementia  without behavioral disturbance ([place])   . Closed fracture of first lumbar vertebra with routine healing   . Impaired fasting glucose   . Advanced care planning/counseling discussion   . Palliative care by specialist   . Encounter for competency evaluation   . Acute low back pain without sciatica   . Closed fracture dislocation of lumbar spine ([place]) 11/07/2019  . Confusion   . Dehydration   . Acute metabolic encephalopathy 32/44/0102  . Type 2 diabetes mellitus ([place]) 11/06/2019  . Essential hypertension 11/06/2019  . COPD (chronic obstructive pulmonary disease) ([place]) 11/06/2019  . Hypokalemia 11/06/2019    Palliative Care Assessment & Plan    Recommendations/Plan:  Hospice facility placement   Code Status:    Code Status Orders  (From admission, onward)         Start     Ordered   11/06/19 2318  Full code  Continuous     11/06/19 2319        Code Status History    This patient has a current code status but no historical code status.   Advance Care Planning Activity       Prognosis:   < 2 weeks Poor PO intake. Dysphagia diet ordered but patient does not like the textures. Spine fractures with rec for operative management. Family declines.    Thank you for allowing the Palliative Medicine Team to assist in the care of this patient.   Total Time 15 min Prolonged Time Billed no      Greater than 50%  of this time was spent counseling and coordinating care related to the above assessment and plan.  Asencion Gowda, NP  Please contact Palliative Medicine Team phone at [phone]  for questions and concerns.

## 2019-11-24 ENCOUNTER — Encounter
Admission: EM | Disposition: A | Discharge status: Hospice | Payer: Self-pay | Source: Skilled Nursing Facility | Attending: Internal Medicine

## 2019-11-24 LAB — GLUCOSE, CAPILLARY: Glucose-Capillary: 146 mg/dL — ABNORMAL HIGH (ref 70–99)

## 2019-11-24 SURGERY — LUMBAR PERCUTANEOUS PEDICLE SCREW 4 LEVEL
Anesthesia: General

## 2019-11-24 NOTE — Progress Notes (Signed)
Nutrition Brief Follow-Up Note  Chart reviewed. Patient has transitioned to comfort care.   No further nutrition interventions warranted at this time. Please consult RD as needed.   Jacklynn Barnacle, MS, RD, LDN Office: [phone] Pager: [phone] After Hours/Weekend Pager: [phone]

## 2019-11-24 NOTE — Progress Notes (Addendum)
Follow up visits made through out the day to new referral for [facility] hospice home. Patient did not eat yesterday and did not take his oral medications. He did receive a PRN dose of IV morphine this morning for pain management. Writer spoke via telephone to patient's brother [name] to initiate education regarding hospice services, philosophy, team approach to care and current visitation policy with understanding voiced. Plan is for transfer to the Hospice home tomorrow via EMS, pending consents. Will continue to follow and update hospital staff and family. Flo Shanks BSN, RN, [place] [phone]

## 2019-11-24 NOTE — Progress Notes (Addendum)
Daily Progress Note   Patient Name: Tony Flores       Date: 11/24/2019 DOB: [birth year]  Age: 85 y.o. MRN#: [id number] Attending Physician: Pennie Banter, DO Primary Care Physician: Patient, No Pcp Per Admit Date: 11/06/2019  Reason for Consultation/Follow-up: Establishing goals of care  Subjective: Patient is resting in bed with eyes closed, no distress noted. Per staff, his oral intake is very poor. He has not eaten any of the lunch on his tray. Plans to transfer to hospice tomorrow.   Length of Stay: 17  Current Medications: Scheduled Meds:  . atenolol  50 mg Oral Daily  . ketotifen  1 drop Left Eye BID  . mirtazapine  15 mg Oral QHS  . polyethylene glycol  17 g Oral Daily  . senna-docusate  1 tablet Oral BID  . sodium chloride flush  3 mL Intravenous Q12H    Continuous Infusions: . sodium chloride      PRN Meds: sodium chloride, acetaminophen **OR** acetaminophen, antiseptic oral rinse, bisacodyl, glycopyrrolate **OR** glycopyrrolate **OR** glycopyrrolate, haloperidol **OR** haloperidol **OR** haloperidol lactate, LORazepam **OR** [DISCONTINUED] LORazepam **OR** LORazepam, morphine injection, ondansetron **OR** ondansetron (ZOFRAN) IV, polyvinyl alcohol, sodium chloride flush  Physical Exam Pulmonary:     Effort: Pulmonary effort is normal.  Neurological:     Mental Status: He is alert.             Vital Signs: BP (!) 147/98 (BP Location: Left Arm)   Pulse 80   Temp 97.8 F (36.6 C) (Oral)   Resp 16   Ht 5\' 10"  (1.778 m)   Wt 70.3 kg   SpO2 96%   BMI 22.24 kg/m  SpO2: SpO2: 96 % O2 Device: O2 Device: Room Air O2 Flow Rate: O2 Flow Rate (L/min): 3 L/min  Intake/output summary:   Intake/Output Summary (Last 24 hours) at 11/24/2019 1447 Last data filed  at 11/23/2019 1840 Gross per 24 hour  Intake -  Output 450 ml  Net -450 ml   LBM: Last BM Date: 11/23/19 Baseline Weight: Weight: 59 kg Most recent weight: Weight: 70.3 kg       Palliative Assessment/Data: 20%    Flowsheet Rows     Most Recent Value  Intake Tab  Referral Department  Hospitalist  Unit at Time of Referral  Med/Surg Unit  Date  Notified  11/09/19  Palliative Care Type  New Palliative care  Reason for referral  Clarify Goals of Care  Date of Admission  11/06/19  Date first seen by Palliative Care  11/10/19  # of days Palliative referral response time  1 Day(s)  # of days IP prior to Palliative referral  3  Clinical Assessment  Psychosocial & Spiritual Assessment  Palliative Care Outcomes      Patient Active Problem List   Diagnosis Date Noted  . Dementia without behavioral disturbance ([place])   . Closed fracture of first lumbar vertebra with routine healing   . Impaired fasting glucose   . Advanced care planning/counseling discussion   . Palliative care by specialist   . Encounter for competency evaluation   . Acute low back pain without sciatica   . Closed fracture dislocation of lumbar spine ([place]) 11/07/2019  . Confusion   . Dehydration   . Acute metabolic encephalopathy 52/84/1324  . Type 2 diabetes mellitus ([place]) 11/06/2019  . Essential hypertension 11/06/2019  . COPD (chronic obstructive pulmonary disease) ([place]) 11/06/2019  . Hypokalemia 11/06/2019    Palliative Care Assessment & Plan    Recommendations/Plan:  Hospice facility placement  No changes to medication regimen.    Code Status:    Code Status Orders  (From admission, onward)         Start     Ordered   11/06/19 2318  Full code  Continuous     11/06/19 2319        Code Status History    This patient has a current code status but no historical code status.   Advance Care Planning Activity       Prognosis:   < 2 weeks Poor PO intake. Dysphagia diet ordered but  patient does not like the textures. Spine fractures with rec for operative management. Family declines.    Thank you for allowing the Palliative Medicine Team to assist in the care of this patient.   Total Time 15 min Prolonged Time Billed no      Greater than 50%  of this time was spent counseling and coordinating care related to the above assessment and plan.  Asencion Gowda, NP  Please contact Palliative Medicine Team phone at [phone] for questions and concerns.

## 2019-11-24 NOTE — Care Management Important Message (Signed)
Important Message  Patient Details  Name: Tony Flores MRN: [id number] Date of Birth: 28-Sep-1930   Medicare Important Message Given:  Other (see comment)  Patient is unable to sign and on comfort care measures with plans to discharge to [place] once a bed is available.   Juliann Pulse A Eurydice Calixto 11/24/2019, 3:11 PM

## 2019-11-24 NOTE — Progress Notes (Signed)
PROGRESS NOTE    Tony Flores  OEU:[id number] DOB: 1930-05-15 DOA: 11/06/2019  PCP: Patient, No Pcp Per    LOS - 17   Brief Narrative:  85 y.o.malewith medical history significant fortype 2 diabetes, hypertension, COPD, and tobacco use who presents to the ED from [facility] independent living facility via EMS with altered mental status and an unwitnessed fall with secondary back pain.  Apparently had several falls within the week.  Evaluation in the ED included head CT which was negative for acute findings but showed chronic ischemic small vessel disease and chronic sinusitis.  Chest x-ray showed atelectasis versus scarring at the lung bases but no focal consolidation edema or effusions.  MRI of lumbar spine showed L1-L2 fracture.  Labs are notable for potassium 3.1 lactic acid 2.3 troponin negative x2 white count 10.5.  Patient was admitted for further evaluation management.  Patient subsequently reported choking on liquids, was evaluated by speech therapy who recommended dysphagia level 2 and nectar thickened liquids with aspiration precautions patient seen by neurosurgery for lumbar fracture, had planned for intervention however patient lacks capacity to consent.   Subsequently, patient was seen by palliative care and his brother/surrogate have elected to avoid surgery and focus on comfort measures.  Patient awaiting residential hospice bed.  Subjective 12/3: No acute events reported overnight.  Patient awake, laying in bed, no sign of distress.  He answers "don't think so" regarding any pain or discomfort.  Answers "don't think so" when asked if feeling sick.     Assessment & Plan:   Principal Problem:   Acute metabolic encephalopathy Active Problems:   Type 2 diabetes mellitus (H[facility])   Essential hypertension   COPD (chronic obstructive pulmonary disease) (H[facility])   Hypokalemia   Closed fracture dislocation of lumbar spine ([facility])   Confusion   Dehydration   Acute low back pain  without sciatica   Encounter for competency evaluation   Advanced care planning/counseling discussion   Palliative care by specialist   Dementia without behavioral disturbance ([place])   Closed fracture of first lumbar vertebra with routine healing   Impaired fasting glucose   Acute metabolic encephalopathy Dementia at baseline -Seen by psychiatry, deemed incompetent for medical decision-making  Lumbar fracture, L1-L2 -Family have declined surgery -Continue comfort measures until hospice bed available  Dysphagia -Continue dysphagia 2 diet with nectar thick liquids Speech therapy following  Essential hypertension, chronic stable continue home atenolol  Impaired fasting glucose Hemoglobin A1c 5.7%  Anorexia /poor appetite   DVT prophylaxis: None (comfort care)   Code Status: DNR  Family Communication: None at bedside Disposition Plan: To residential hospice pending bed   Consultants:   Palliative  Neurosurgery  Procedures:   None  Antimicrobials:   None   Objective: Vitals:   11/22/19 0809 11/22/19 1414 11/22/19 2004 11/23/19 0945  BP: 105/65 123/66 (!) 139/91 (!) 147/98  Pulse: 94 90 82 80  Resp: 18 18 17 16   Temp: 97.9 F (36.6 C)  98.2 F (36.8 C) 97.8 F (36.6 C)  TempSrc:    Oral  SpO2: 92% 92% 93% 96%  Weight:      Height:        Intake/Output Summary (Last 24 hours) at 11/24/2019 1329 Last data filed at 11/23/2019 1840 Gross per 24 hour  Intake -  Output 450 ml  Net -450 ml   Filed Weights   11/06/19 1651 11/07/19 0530  Weight: 59 kg 70.3 kg    Examination:  General exam: awake, alert,  no acute distress Respiratory system: clear to auscultation bilaterally, no wheezes, rales or rhonchi, normal respiratory effort. Cardiovascular system: normal S1/S2, RRR, no JVD, murmurs, rubs, gallops, no pedal edema.   Gastrointestinal system: soft, non-tender, non-distended abdomen, normal bowel sounds. Extremities: moves all, no edema,  normal tone    Data Reviewed: I have personally reviewed following labs and imaging studies  CBC: Recent Labs  Lab 11/19/19 0455  WBC 6.4  HGB 14.1  HCT 42.2  MCV 87.7  PLT 253   Basic Metabolic Panel: Recent Labs  Lab 11/19/19 0455 11/22/19 0438  NA 144 148*  K 3.6 3.9  CL 104 109  CO2 30 26  GLUCOSE 144* 164*  BUN 26* 31*  CREATININE 0.62 0.70  CALCIUM 8.7* 8.8*   GFR: Estimated Creatinine Clearance: 65.9 mL/min (by C-G formula based on SCr of 0.7 mg/dL). Liver Function Tests: No results for input(s): AST, ALT, ALKPHOS, BILITOT, PROT, ALBUMIN in the last 168 hours. No results for input(s): LIPASE, AMYLASE in the last 168 hours. No results for input(s): AMMONIA in the last 168 hours. Coagulation Profile: No results for input(s): INR, PROTIME in the last 168 hours. Cardiac Enzymes: No results for input(s): CKTOTAL, CKMB, CKMBINDEX, TROPONINI in the last 168 hours. BNP (last 3 results) No results for input(s): PROBNP in the last 8760 hours. HbA1C: No results for input(s): HGBA1C in the last 72 hours. CBG: Recent Labs  Lab 11/23/19 0738 11/23/19 1136 11/23/19 1654 11/23/19 2152 11/24/19 0746  GLUCAP 138* 146* 134* 132* 146*   Lipid Profile: No results for input(s): CHOL, HDL, LDLCALC, TRIG, CHOLHDL, LDLDIRECT in the last 72 hours. Thyroid Function Tests: No results for input(s): TSH, T4TOTAL, FREET4, T3FREE, THYROIDAB in the last 72 hours. Anemia Panel: No results for input(s): VITAMINB12, FOLATE, FERRITIN, TIBC, IRON, RETICCTPCT in the last 72 hours. Sepsis Labs: No results for input(s): PROCALCITON, LATICACIDVEN in the last 168 hours.  No results found for this or any previous visit (from the past 240 hour(s)).       Radiology Studies: No results found.      Scheduled Meds: . atenolol  50 mg Oral Daily  . ketotifen  1 drop Left Eye BID  . mirtazapine  15 mg Oral QHS  . polyethylene glycol  17 g Oral Daily  . senna-docusate  1 tablet  Oral BID  . sodium chloride flush  3 mL Intravenous Q12H   Continuous Infusions: . sodium chloride       LOS: 17 days    Time spent: 20-25 minutes    Pennie Banter, DO [facility] Pager: [phone]  If 7PM-7AM, please contact night-coverage [email or web address] Password [email or web address] 11/24/2019, 1:29 PM

## 2019-11-25 LAB — GLUCOSE, CAPILLARY: Glucose-Capillary: 129 mg/dL — ABNORMAL HIGH (ref 70–99)

## 2019-11-25 MED ORDER — ACETAMINOPHEN 650 MG RE SUPP: 650.0000 mg | Freq: Four times a day (QID) | RECTAL | 0 refills | Status: AC | PRN

## 2019-11-25 MED ORDER — POLYVINYL ALCOHOL 1.4 % OP SOLN: 1.0000 [drp] | Freq: Four times a day (QID) | OPHTHALMIC | 0 refills | Status: AC | PRN

## 2019-11-25 MED ORDER — HALOPERIDOL LACTATE 2 MG/ML PO CONC: 0.6000 mg | ORAL | 0 refills | Status: AC | PRN

## 2019-11-25 MED ORDER — MIRTAZAPINE 15 MG PO TABS: 15.0000 mg | ORAL_TABLET | Freq: Every day | ORAL | Status: AC

## 2019-11-25 MED ORDER — KETOTIFEN FUMARATE 0.025 % OP SOLN: 1.0000 [drp] | Freq: Two times a day (BID) | OPHTHALMIC | 0 refills | Status: AC

## 2019-11-25 MED ORDER — ONDANSETRON 4 MG PO TBDP: 4.0000 mg | ORAL_TABLET | Freq: Four times a day (QID) | ORAL | 0 refills | Status: AC | PRN

## 2019-11-25 MED ORDER — GLYCOPYRROLATE 1 MG PO TABS: 1.0000 mg | ORAL_TABLET | ORAL | Status: AC | PRN

## 2019-11-25 NOTE — Discharge Summary (Signed)
Physician Discharge Summary  Towanda MalkinJames A [place]erville ZOX:[id number] DOB: 08/02/1930 DOA: 11/06/2019  PCP: Patient, No Pcp Per  Admit date: 11/06/2019 Discharge date: 11/25/2019   [facility] Disposition:  Residential Hospice  Recommendations for Outpatient Follow-up:  1. Follow up with PCP as discretion of hospice   Home Health: No   Equipment/Devices: None   Discharge Condition: Comfort Care  CODE STATUS: DNR  Diet recommendation: Dysphagia level 2, nectar thick liquids  Brief/Interim Summary:  85 y.o.malewith medical history significant fortype 2 diabetes, hypertension, COPD, and tobacco use [facility] with altered mental status and an unwitnessed fallwith secondary back pain.Apparently had several falls within the week. Evaluation in the ED included head CT which was negative for acute findings but showed chronic ischemic small vessel disease and chronic sinusitis. Chest x-ray showed atelectasis versus scarring at the lung bases but no focal consolidation edema or effusions. MRI of lumbar spine showed L1-L2 fracture. Labs are notable for potassium 3.1 lactic acid 2.3 troponin negative x2 white count 10.5.Patient was admitted for further evaluation management. Patient subsequently reported choking on liquids, was evaluated by speech therapy who recommended dysphagia level 2 and nectar thickened liquids with aspiration precautions patient seen by neurosurgery for lumbar fracture, had planned for intervention however patient lacks capacity to consent. Subsequently, patient was seen by palliative care and his brother/surrogate have elected to avoid risks of spine surgery and focus on comfort measures.  During hospital stay, patient had very little oral intake, consistent with end of life changes.  Patient discharging to residential hospice on comfort measures.   Discharge  Diagnoses: Principal Problem:   Acute metabolic encephalopathy Active Problems:   Type 2 diabetes mellitus (HCC)   Essential hypertension   COPD (chronic obstructive pulmonary disease) (HCC)   Hypokalemia   Closed fracture dislocation of lumbar spine (HCC)   Confusion   Dehydration   Acute low back pain without sciatica   Encounter for competency evaluation   Advanced care planning/counseling discussion   Palliative care by specialist   Dementia without behavioral disturbance (HCC)   Closed fracture of first lumbar vertebra with routine healing   Impaired fasting glucose    Discharge Instructions    Allergies as of 11/25/2019   No Known Allergies     Medication List    STOP taking these medications   glimepiride 2 MG tablet Commonly known as: AMARYL   hydrochlorothiazide 25 MG tablet Commonly known as: HYDRODIURIL   metFORMIN 750 MG 24 hr tablet Commonly known as: GLUCOPHAGE-XR     TAKE these medications   acetaminophen 650 MG suppository Commonly known as: TYLENOL Place 1 suppository (650 mg total) rectally every 6 (six) hours as needed for mild pain (or Fever >/= 101).   atenolol 50 MG tablet Commonly known as: TENORMIN Take 50 mg by mouth daily.   glycopyrrolate 1 MG tablet Commonly known as: ROBINUL Take 1 tablet (1 mg total) by mouth every 4 (four) hours as needed (excessive secretions).   haloperidol 2 MG/ML solution Commonly known as: HALDOL Place 0.3 mLs (0.6 mg total) under the tongue every 4 (four) hours as needed for agitation (or delirium).   ketotifen 0.025 % ophthalmic solution Commonly known as: ZADITOR Place 1 drop into the left eye 2 (two) times daily.   mirtazapine 15 MG tablet Commonly known as: REMERON Take 1 tablet (15 mg total) by mouth at bedtime.   ondansetron 4 MG disintegrating  tablet Commonly known as: ZOFRAN-ODT Take 1 tablet (4 mg total) by mouth every 6 (six) hours as needed for nausea.   polyvinyl alcohol 1.4 %  ophthalmic solution Commonly known as: LIQUIFILM TEARS Place 1 drop into both eyes 4 (four) times daily as needed for dry eyes.       Contact information for follow-up providers    Go to  [facility] EMERGENCY DEPARTMENT.   Specialty: Emergency Medicine Why: If symptoms worsen Contact information: [address] [id number] [place] [address] [phone]           Contact information for after-discharge care    Destination    [facility] Preferred [facility] .   Service: Skilled Nursing Contact information: [address] [place] [address] [phone]                 No Known Allergies  Consultations:  Palliative Care  Hospice  Neurosurgery   Procedures/Studies: Dg Lumbar Spine 2-3 Views  Addendum Date: 11/07/2019   ADDENDUM REPORT: 11/07/2019 00:14 ADDENDUM: Study discussed by telephone with Dr. Don Perking on 11/07/2019 at 0003 hours. Electronically Signed   By: Odessa Fleming M.D.   On: 11/07/2019 00:14   Result Date: 11/07/2019 CLINICAL DATA:  85 year old male with back pain after fall. EXAM: LUMBAR SPINE - 2-3 VIEW COMPARISON:  Lumbar radiographs 06/14/2009. FINDINGS: Lumbar segmentation appears normal. There is evidence of chronic ankylosis through the lower thoracic and upper lumbar spine. There is a horizontal plane fracture through the L1-L2 disc space or lower L1 body evident on the lateral view. This is minimally displaced. Other lumbar levels appear intact. Chronic upper lumbar ankylosis likely through the L3 level is associated with bulky chronic endplate spurring at L3-L4. Probable chronic bilateral SI joint ankylosis. Extensive Aortoiliac calcified atherosclerosis. Increased gas in nondilated bowel loops. IMPRESSION: 1. Horizontally oriented acute fracture through chronically ankylosed upper lumbar spine at L1-L2. This is likely an unstable injury, and either Lumbar CT or MRI would be  valuable for further characterization. Recommend spine precautions and Spine Surgery consultation. 2. Chronic spine and SI joint ankylosis suspected. 3.  Aortic Atherosclerosis (ICD10-I70.0). Electronically Signed: By: Odessa Fleming M.D. On: 11/06/2019 23:58   Dg Lumbar Spine Complete  Result Date: 11/08/2019 CLINICAL DATA:  Chronic low back pain unable to remove brace EXAM: LUMBAR SPINE - COMPLETE 4+ VIEW COMPARISON:  CT 11/07/2019, 11/06/2019 FINDINGS: Stable lumbar alignment. Ankylosis of the thoracolumbar spine to the level of L3. Acute fracture through the L1-L2 disc space with similar degree of distraction as compared with the interval lumbar CT. No vertebral body collapse. Remaining vertebral bodies demonstrate normal stature. Disc space narrowing and bulky osteophyte at L3-L4 and L4-L5. Moderate disc space narrowing at L5-S1. Posterior facet degenerative changes. Dense aortic atherosclerosis. IMPRESSION: 1. Ankylosis of the thoracolumbar spine to the level of L3 with redemonstrated acute fracture through the L1-L2 disc space, similar degree of distraction as compared with interval CT. No significant listhesis. 2. Degenerative changes elsewhere in the lumbar spine. Electronically Signed   By: Jasmine Pang M.D.   On: 11/08/2019 19:07   Dg Abdomen 1 View  Result Date: 11/07/2019 CLINICAL DATA:  MRI clearance EXAM: ABDOMEN - 1 VIEW COMPARISON:  None. FINDINGS: No visible or radiopaque foreign body. Vascular calcifications noted in the upper abdomen. Nonobstructive bowel gas pattern. No organomegaly or free air. IMPRESSION: No radiopaque foreign body. No bowel obstruction or free air. Electronically Signed   By: Caryn Bee  Dover M.D.   On: 11/07/2019 01:00   Ct Head Wo Contrast  Result Date: 11/06/2019 CLINICAL DATA:  Unwitnessed fall, altered mental status. Hyperglycemia. EXAM: CT HEAD WITHOUT CONTRAST TECHNIQUE: Contiguous axial images were obtained from the base of the skull through the vertex without  intravenous contrast. COMPARISON:  None. FINDINGS: Brain: The brainstem, cerebellum, cerebral peduncles, thalami, basal ganglia, basilar cisterns, and ventricular system appear within normal limits. Periventricular white matter and corona radiata hypodensities favor chronic ischemic microvascular white matter disease. No intracranial hemorrhage, mass lesion, or acute CVA. Vascular: There is atherosclerotic calcification of the cavernous carotid arteries bilaterally. Skull: Prominent confluence of occipital arachnoid granulations, likely incidental. Sinuses/Orbits: Chronic ethmoid, frontal, and left sphenoid sinusitis. Other: No supplemental non-categorized findings. IMPRESSION: 1. No acute intracranial findings. 2. Periventricular white matter and corona radiata hypodensities favor chronic ischemic microvascular white matter disease. 3. Chronic paranasal sinusitis. 4. Prominent confluence of occipital arachnoid granulations. Electronically Signed   By: Gaylyn Rong M.D.   On: 11/06/2019 18:15   Ct Lumbar Spine Wo Contrast  Result Date: 11/07/2019 CLINICAL DATA:  85 year old male status post fall with back pain and L1-L2 level fracture superimposed on spinal ankylosis identified radiographically earlier tonight. EXAM: CT LUMBAR SPINE WITHOUT CONTRAST TECHNIQUE: Multidetector CT imaging of the lumbar spine was performed without intravenous contrast administration. Multiplanar CT image reconstructions were also generated. COMPARISON:  Lumbar radiographs earlier tonight. FINDINGS: Segmentation: Normal. Alignment: Stable alignment. In the AP plane on coronal images on lateral images there is increased kyphosis about the L1-L2 fracture, with increased distraction along the anterior L1-L2 disc space (series 7, image 37). No spondylolisthesis. See additional details below. Vertebrae: Osteopenia with flowing endplate osteophytes or syndesmophytes from the lower thoracic spine through L3 resulting in ankylosis.  Bulky mostly anterior endplate osteophyte at L3-L4 without ankylosis. Similar L4-L5 and L5-S1 degenerative changes without ankylosis. A degree of bilateral SI joint ankylosis is noted on series 3, image 123. Horizontal fracture through the L1-L2 disc space is now mildly distracted anteriorly (up to 7 millimeters). The fracture extends to the posterior disc space, although the L1 and L2 pedicles and other posterior elements appear to remain intact. No other fracture identified. Paraspinal and other soft tissues: There is mild ventral and mild lateral paraspinal hematoma at L1-L2 (series 4, image 32). Associated thickening of the posterior right hemidiaphragm, and small volume hematoma tracking ventral to the right psoas muscle (series 4, image 84). Aortoiliac calcified atherosclerosis. Vascular patency is not evaluated in the absence of IV contrast. Partially visible large bowel diverticulosis. Disc levels: Ankylosis from the lower thoracic spine through L3 with no evidence of spinal stenosis. L3-L4 through L5-S1 disc, endplate, and posterior element degeneration. At L4-L5 there is mild to moderate spinal stenosis with left greater than right lateral recess stenosis (series 4, image 95) and moderate to severe left L4 foraminal stenosis. IMPRESSION: 1. Osteopenia with lower thoracic through L3 ankylosis, and a degree of bilateral SI joint ankylosis. 2. Horizontal fracture through the L1-L2 disc space fracture now with mild distraction of the anterior disc space / mild kyphosis. But no spondylolisthesis, and the posterior elements at each level appear to remain intact and normally aligned. 3. Associated small volume hematoma in the paraspinal soft tissues tracking inferiorly along the right psoas muscle. 4. No other acute osseous abnormality identified. 5. Degenerative multifactorial spinal and left neural foraminal stenosis at L4-L5. 6. Aortic Atherosclerosis (ICD10-I70.0). Large bowel diverticulosis. Electronically  Signed   By: Odessa Fleming M.D.   On:  11/07/2019 01:55   Mr Lumbar Spine Wo Contrast  Result Date: 11/20/2019 CLINICAL DATA:  Lumbar spine fracture. EXAM: MRI LUMBAR SPINE WITHOUT CONTRAST TECHNIQUE: Multiplanar, multisequence MR imaging of the lumbar spine was performed. No intravenous contrast was administered. COMPARISON:  Lumbar spine radiographs 11/08/2019 and lumbar spine CT 11/07/2019 FINDINGS: The patient was confused and agitated, and the study is severely motion degraded despite repeated imaging attempts. Axial T1 weighted imaging was not performed. Segmentation: 5 lumbar type vertebrae. Alignment: Mild left convex lumbar spine curvature. No significant listhesis. Vertebrae: Thoracolumbar spine ankylosis to the L3 level. Horizontal fracture through the L1-2 disc space with a fluid-filled fracture cleft and a similar degree of anterior distraction compared to the prior CT. Mild marrow edema in the L1 and L2 vertebral bodies. No evidence of a significant epidural hematoma. Conus medullaris and cauda equina: Conus extends to the upper L1 level. Conus and cauda equina appear normal. Paraspinal and other soft tissues: Mild paraspinal soft tissue edema at L1-2. Small T2 hyperintense lesions in both kidneys, likely cysts. Disc levels: Detailed assessment of degenerative changes is limited by motion artifact. Disc space narrowing is mild at L3-4 and moderate at L4-5 and L5-S1. There is mild lateral recess stenosis bilaterally at L4-5 and on the left at L5-S1 related to left eccentric disc bulging and facet hypertrophy. Generalized spinal stenosis at L4-5 is mild. Neural foraminal stenosis is moderate to severe on the left at L4-5 and L5-S1. IMPRESSION: 1. Severely motion degraded examination. 2. Horizontal fracture through the L1-2 disc space with similar degree of anterior distraction. Mild L1 and L2 marrow edema. No significant epidural hematoma. 3. Lower lumbar disc and facet degeneration as above. No high-grade  spinal stenosis. Electronically Signed   By: Logan Bores M.D.   On: 11/20/2019 08:32   Dg Chest Portable 1 View  Result Date: 11/06/2019 CLINICAL DATA:  Elevated lactate unwitnessed fall EXAM: PORTABLE CHEST 1 VIEW COMPARISON:  None. FINDINGS: Streaky atelectasis or scarring at the bases. No consolidation or effusion. Cardiomediastinal silhouette within normal limits with aortic atherosclerosis. No pneumothorax. IMPRESSION: Mild scarring or atelectasis at the bases. Electronically Signed   By: Donavan Foil M.D.   On: 11/06/2019 19:10        Subjective: Patient awake, laying in bed.  No acute events reported overnight.  No apparent distress.  Denies pain or discomfort at this time.   Discharge Exam: Vitals:   11/22/19 2004 11/23/19 0945  BP: (!) 139/91 (!) 147/98  Pulse: 82 80  Resp: 17 16  Temp: 98.2 F (36.8 C) 97.8 F (36.6 C)  SpO2: 93% 96%   Vitals:   11/22/19 0809 11/22/19 1414 11/22/19 2004 11/23/19 0945  BP: 105/65 123/66 (!) 139/91 (!) 147/98  Pulse: 94 90 82 80  Resp: 18 18 17 16   Temp: 97.9 F (36.6 C)  98.2 F (36.8 C) 97.8 F (36.6 C)  TempSrc:    Oral  SpO2: 92% 92% 93% 96%  Weight:      Height:        General: Pt is alert, awake, not in acute distress, frail, underweight Cardiovascular: RRR, S1/S2 +, no rubs, no gallops Respiratory: CTA bilaterally, no wheezing, no rhonchi Abdominal: Soft, NT, ND, bowel sounds + Extremities: no edema, no cyanosis    The results of significant diagnostics from this hospitalization (including imaging, microbiology, ancillary and laboratory) are listed below for reference.     Microbiology: No results found for this or any previous visit (from the past  240 hour(s)).   Labs: BNP (last 3 results) No results for input(s): BNP in the last 8760 hours. Basic Metabolic Panel: Recent Labs  Lab 11/19/19 0455 11/22/19 0438  NA 144 148*  K 3.6 3.9  CL 104 109  CO2 30 26  GLUCOSE 144* 164*  BUN 26* 31*  CREATININE  0.62 0.70  CALCIUM 8.7* 8.8*   Liver Function Tests: No results for input(s): AST, ALT, ALKPHOS, BILITOT, PROT, ALBUMIN in the last 168 hours. No results for input(s): LIPASE, AMYLASE in the last 168 hours. No results for input(s): AMMONIA in the last 168 hours. CBC: Recent Labs  Lab 11/19/19 0455  WBC 6.4  HGB 14.1  HCT 42.2  MCV 87.7  PLT 253   Cardiac Enzymes: No results for input(s): CKTOTAL, CKMB, CKMBINDEX, TROPONINI in the last 168 hours. BNP: Invalid input(s): POCBNP CBG: Recent Labs  Lab 11/23/19 0738 11/23/19 1136 11/23/19 1654 11/23/19 2152 11/24/19 0746  GLUCAP 138* 146* 134* 132* 146*   D-Dimer No results for input(s): DDIMER in the last 72 hours. Hgb A1c No results for input(s): HGBA1C in the last 72 hours. Lipid Profile No results for input(s): CHOL, HDL, LDLCALC, TRIG, CHOLHDL, LDLDIRECT in the last 72 hours. Thyroid function studies No results for input(s): TSH, T4TOTAL, T3FREE, THYROIDAB in the last 72 hours.  Invalid input(s): FREET3 Anemia work up No results for input(s): VITAMINB12, FOLATE, FERRITIN, TIBC, IRON, RETICCTPCT in the last 72 hours. Urinalysis    Component Value Date/Time   COLORURINE YELLOW (A) 11/08/2019 0744   APPEARANCEUR CLEAR (A) 11/08/2019 0744   LABSPEC 1.018 11/08/2019 0744   PHURINE 5.0 11/08/2019 0744   GLUCOSEU NEGATIVE 11/08/2019 0744   HGBUR NEGATIVE 11/08/2019 0744   BILIRUBINUR NEGATIVE 11/08/2019 0744   KETONESUR 5 (A) 11/08/2019 0744   PROTEINUR NEGATIVE 11/08/2019 0744   NITRITE NEGATIVE 11/08/2019 0744   LEUKOCYTESUR NEGATIVE 11/08/2019 0744   Sepsis Labs Invalid input(s): PROCALCITONIN,  WBC,  LACTICIDVEN Microbiology No results found for this or any previous visit (from the past 240 hour(s)).   Time coordinating discharge: Over 30 minutes  SIGNED:   Pennie Banter, DO [facility] 11/25/2019, 7:49 AM Pager [phone]  If 7PM-7AM, please contact night-coverage [email or web address] Password  [email or web address]

## 2019-11-25 NOTE — Progress Notes (Signed)
Follow up visit made to new referral for [facility] home. Patient lying in bed will respond to name, extremities cool. Plan is for transport to the hospice home today. Hospital Care team updated. Report called to the hospice home, EMS notified for transport. Staff RN to premedicate patient for pain prior to transport. Flo Shanks BSN, RN, [place] [phone]

## 2019-11-25 NOTE — TOC Transition Note (Signed)
Transition of Care [facility]) - CM/SW Discharge Note   Patient Details  Name: Tony Flores MRN: [id number] Date of Birth: Dec 18, 1930  [facility] CM/SW Contact:  Su Hilt, RN Phone Number: 11/25/2019, 9:13 AM   Clinical Narrative:     Patient to discharge to Hospice home today via EMS Santiago Glad with [facility] is aware of the DC and setting up arrangements         Patient Goals and CMS Choice        Discharge Placement                       Discharge Plan and Services                                     Social Determinants of Health (SDOH) Interventions     Readmission Risk Interventions No flowsheet data found.

## 2019-12-23 DEATH — deceased

## 2021-11-10 IMAGING — CR DG LUMBAR SPINE COMPLETE 4+V
1 series · 5 of 5 positions shown · non-contrast
Comparison: CT 11/07/2019, 11/06/2019

CLINICAL DATA: Chronic low back pain unable to remove brace

EXAM:
LUMBAR SPINE - COMPLETE 4+ VIEW

[Series 1: dg lumbar spine complete 4 +v · 0.14mm/px · 5 of 5 slices shown]
[im 1/5]
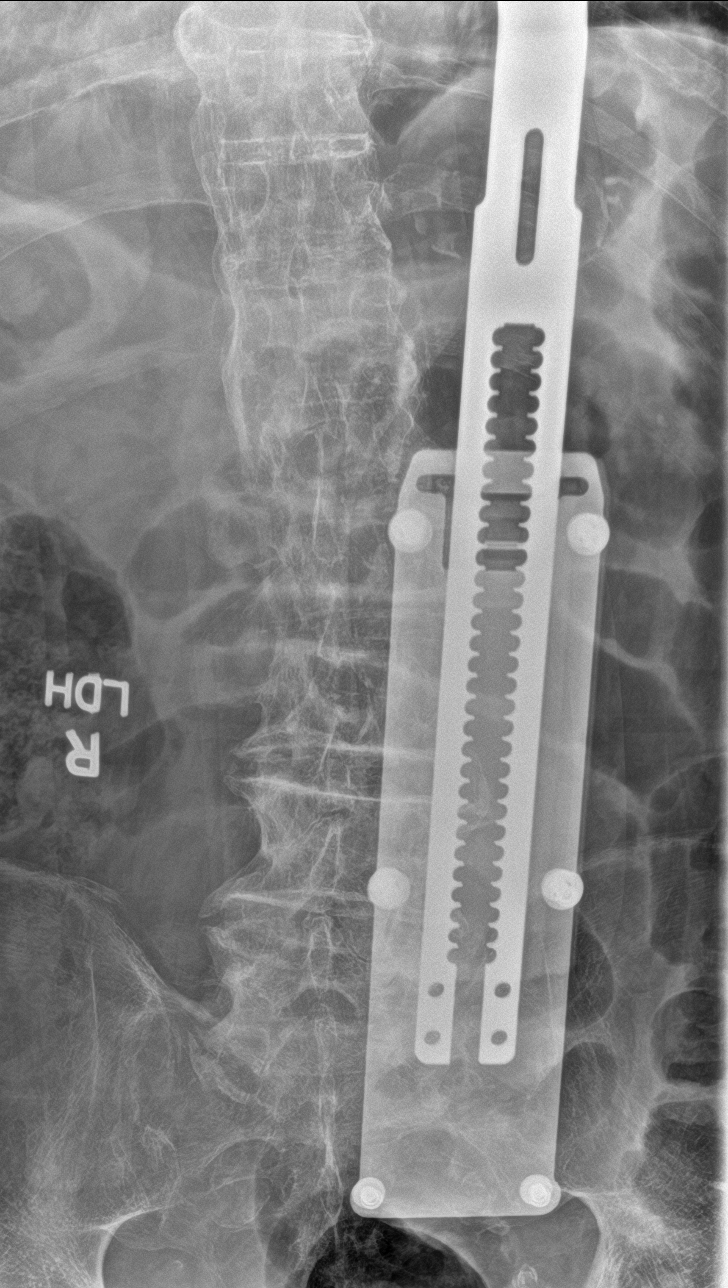
[im 2/5]
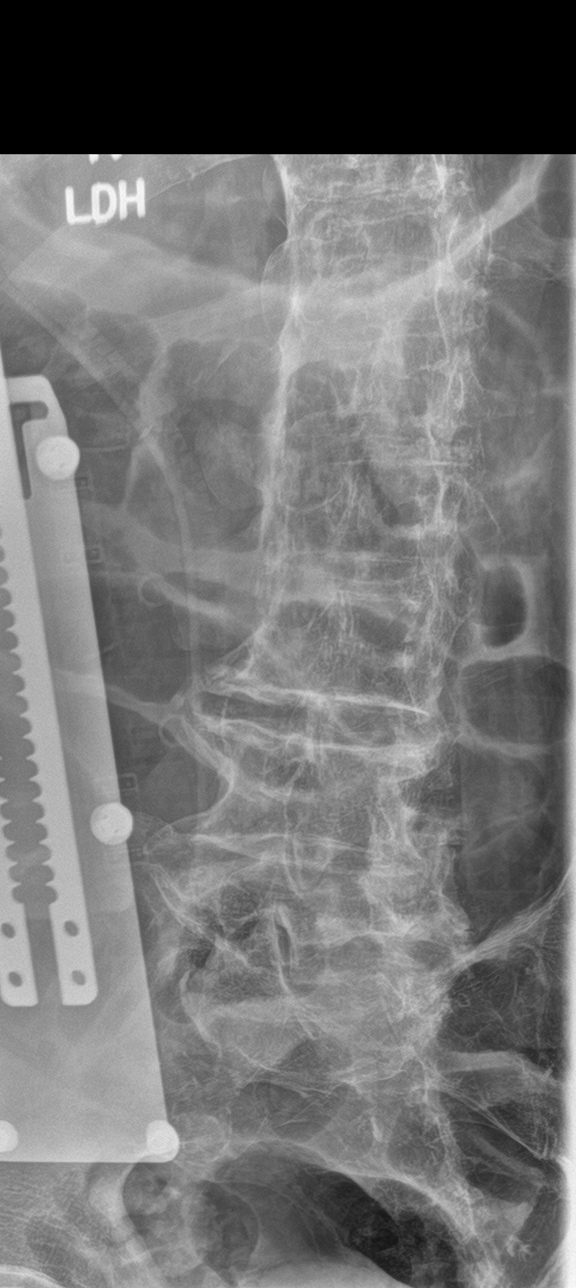
[im 3/5]
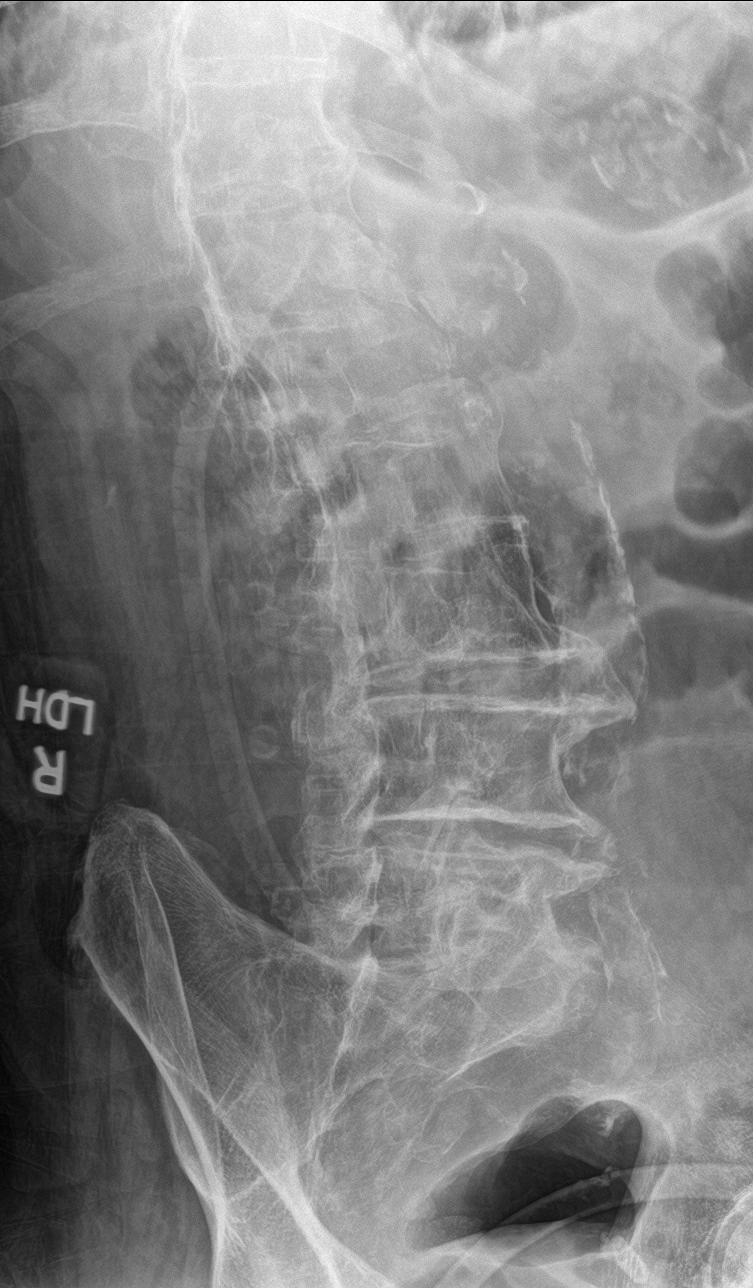
[im 4/5]
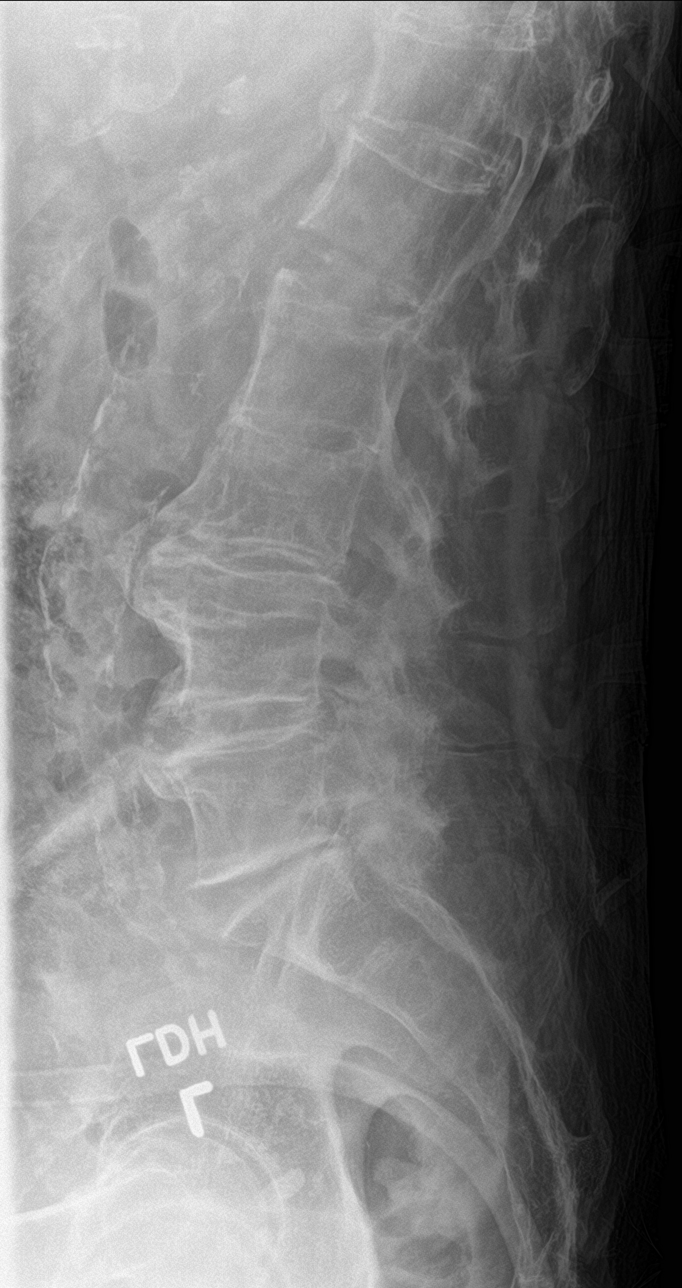
[im 5/5]
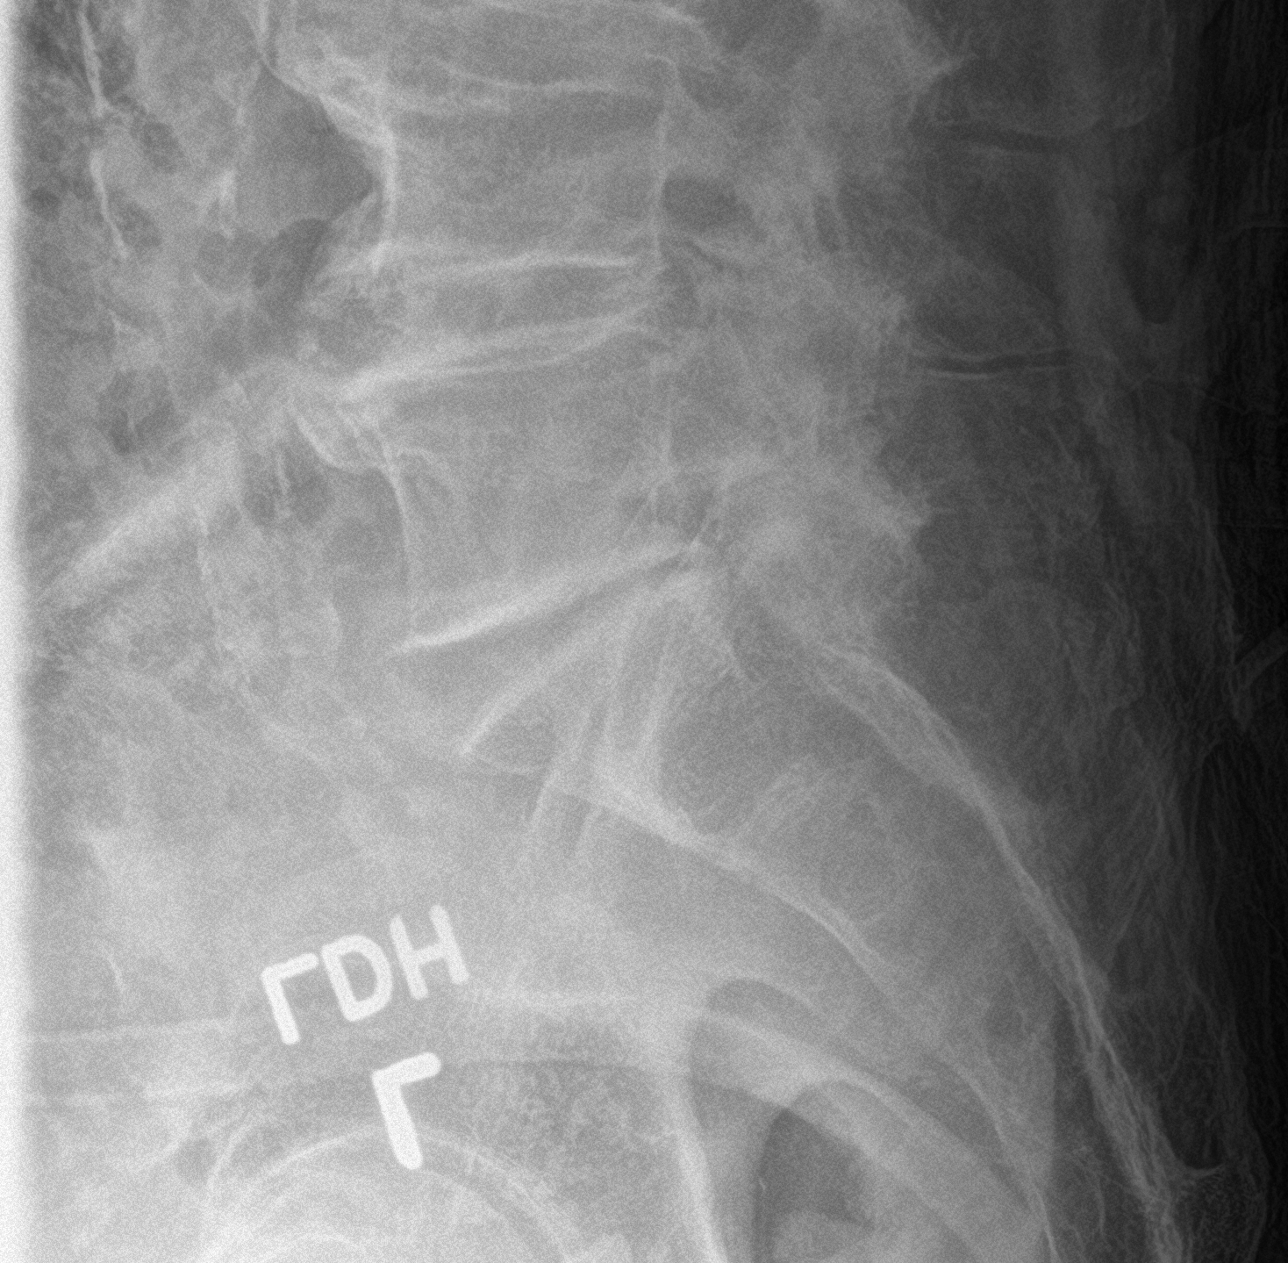

[5 of 5 positions shown; findings below may reference images not displayed]

FINDINGS: Stable lumbar alignment. Ankylosis of the thoracolumbar spine to the
level of L3. Acute fracture through the L1-L2 disc space with
similar degree of distraction as compared with the interval lumbar
CT. No vertebral body collapse. Remaining vertebral bodies
demonstrate normal stature. Disc space narrowing and bulky
osteophyte at L3-L4 and L4-L5. Moderate disc space narrowing at
L5-S1. Posterior facet degenerative changes. Dense aortic
atherosclerosis.
IMPRESSION: 1. Ankylosis of the thoracolumbar spine to the level of L3 with
redemonstrated acute fracture through the L1-L2 disc space, similar
degree of distraction as compared with interval CT. No significant
listhesis.
2. Degenerative changes elsewhere in the lumbar spine.
